# Patient Record
Sex: Female | Born: 1985 | Race: Asian | Hispanic: No | Marital: Married | Smoking: Never smoker
Health system: Southern US, Community
[De-identification: ages and names within clinical notes are randomized; demographics above are authoritative.]

## PROBLEM LIST (undated history)

## (undated) DIAGNOSIS — Z789 Other specified health status: Secondary | ICD-10-CM

## (undated) HISTORY — PX: NO PAST SURGERIES: SHX2092

## (undated) HISTORY — DX: Other specified health status: Z78.9

---

## 2009-08-14 ENCOUNTER — Ambulatory Visit (HOSPITAL_COMMUNITY): Admission: RE | Admit: 2009-08-14 | Discharge: 2009-08-14 | Payer: Self-pay | Admitting: Obstetrics and Gynecology

## 2009-08-17 ENCOUNTER — Encounter (INDEPENDENT_AMBULATORY_CARE_PROVIDER_SITE_OTHER): Payer: Self-pay | Admitting: Obstetrics and Gynecology

## 2009-08-17 ENCOUNTER — Inpatient Hospital Stay (HOSPITAL_COMMUNITY): Admission: RE | Admit: 2009-08-17 | Discharge: 2009-08-19 | Payer: Self-pay | Admitting: Obstetrics and Gynecology

## 2010-10-28 LAB — CBC
Hemoglobin: 13 g/dL (ref 12.0–15.0)
MCHC: 34 g/dL (ref 30.0–36.0)
RBC: 4.21 MIL/uL (ref 3.87–5.11)
RDW: 12.9 % (ref 11.5–15.5)
WBC: 11.2 10*3/uL — ABNORMAL HIGH (ref 4.0–10.5)

## 2010-10-28 LAB — OVA AND PARASITE EXAMINATION: Ova and parasites: NONE SEEN

## 2010-10-28 LAB — RPR: RPR Ser Ql: NONREACTIVE

## 2013-08-12 NOTE — L&D Delivery Note (Signed)
Patient is 28 y.o. [id number] 6568w5d by LMP admitted in active labor, hx of SGA current pregnancy, abnormal 1h gtt with normal 3h gtt   Delivery Note At 11:18 AM a viable female was delivered via Vaginal, Spontaneous Delivery (Presentation: ; Occiput Anterior with compound posterior hand).  APGAR: 8, 9; weight  .   Placenta status: Intact, Spontaneous.  Cord: 3 vessels with the following complications: None  Anesthesia: None  Episiotomy: None Lacerations: perineal abrasion, no lacerations Suture Repair: n/a Est. Blood Loss (mL): 300  Mom to postpartum.  Baby to Couplet care / Skin to Skin.  Heather Villa ROCIO 07/04/2014, 11:46 AM

## 2013-11-06 ENCOUNTER — Emergency Department (HOSPITAL_COMMUNITY)
Admission: EM | Admit: 2013-11-06 | Discharge: 2013-11-06 | Disposition: A | Discharge status: Home / Self Care | Payer: 59 | Attending: Emergency Medicine | Admitting: Emergency Medicine

## 2013-11-06 ENCOUNTER — Encounter (HOSPITAL_COMMUNITY): Payer: Self-pay | Admitting: Emergency Medicine

## 2013-11-06 DIAGNOSIS — H11422 Conjunctival edema, left eye: Secondary | ICD-10-CM

## 2013-11-06 DIAGNOSIS — H11429 Conjunctival edema, unspecified eye: Secondary | ICD-10-CM | POA: Insufficient documentation

## 2013-11-06 MED ORDER — FLUORESCEIN SODIUM 1 MG OP STRP
ORAL_STRIP | OPHTHALMIC | Status: AC
  Filled 2013-11-06: qty 1

## 2013-11-06 MED ORDER — KETOTIFEN FUMARATE 0.025 % OP SOLN
1.0000 [drp] | Freq: Two times a day (BID) | OPHTHALMIC | Status: DC
  Administered 2013-11-06: 1 [drp] via OPHTHALMIC
  Filled 2013-11-06: qty 5

## 2013-11-06 MED ORDER — TETRACAINE HCL 0.5 % OP SOLN
1.0000 [drp] | Freq: Once | OPHTHALMIC | Status: AC
  Administered 2013-11-06: 1 [drp] via OPHTHALMIC
  Filled 2013-11-06: qty 2

## 2013-11-06 MED ORDER — FLUORESCEIN SODIUM 1 MG OP STRP
1.0000 | ORAL_STRIP | Freq: Once | OPHTHALMIC | Status: AC
  Administered 2013-11-06: 1 via OPHTHALMIC

## 2013-11-06 NOTE — ED Provider Notes (Signed)
CSN: [id number]     Arrival date & time 11/06/13  1035 History   First MD Initiated Contact with Patient 11/06/13 1040    This chart was scribed for Fayrene Helper PA-C, a non-physician practitioner working with Gerhard Munch, MD by Lewanda Rife, ED Scribe. This patient was seen in room TR04C/TR04C and the patient's care was started at 10:41 AM      Chief Complaint  Patient presents with  . Eye Problem     (Consider location/radiation/quality/duration/timing/severity/associated sxs/prior Treatment) The history is provided by the patient. A language interpreter was used (Burmese).   HPI Comments: Heather Villa is a 28 y.o. female who presents to the Emergency Department complaining of constant moderate left pain onset 3 months. Describes left eye pain as foreign body sensation. Reports associated left eye itchiness, drainage, and redness. Reports trying OTC allergy drops with no relief of symptoms. Denies any alleviating or aggravating factors. Denies associated fever, chills, headache, diplopia, pain with eye movements, photophobia, and pain with eye movements. Denies following up with an eye doctor for this problem. Denies wearing corrective lenses. States she is [redacted] weeks pregnant. 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   No past medical history on file. No past surgical history on file. No family history on file. History  Substance Use Topics  . Smoking status: Not on file  . Smokeless tobacco: Not on file  . Alcohol Use: Not on file   OB History   No data available     Review of Systems  Constitutional: Negative for fever.  Eyes: Positive for pain, discharge, redness and itching.  Psychiatric/Behavioral: Negative for confusion.      Allergies  Review of patient's allergies indicates not on file.  Home Medications  No current outpatient prescriptions on file. There were no vitals taken for this visit. Physical Exam  Nursing note and vitals reviewed. Constitutional: She is  oriented to person, place, and time. She appears well-developed and well-nourished. No distress.  HENT:  Head: Normocephalic and atraumatic.  Eyes: EOM are normal. Pupils are equal, round, and reactive to light. Left eye exhibits chemosis. Left eye exhibits no discharge and no exudate. Left conjunctiva is injected.  Slit lamp exam:      The left eye shows no fluorescein uptake.  Chemosis through out, left eye is injected limbic sparing.   Mild edema noted to the inferior left periorbital region without evidence of cellulitis  Neck: Neck supple. No tracheal deviation present.  Cardiovascular: Normal rate.   Pulmonary/Chest: Effort normal. No respiratory distress.  Musculoskeletal: Normal range of motion.  Neurological: She is alert and oriented to person, place, and time.  Skin: Skin is warm and dry.  Psychiatric: She has a normal mood and affect. Her behavior is normal.    ED Course  Procedures  COORDINATION OF CARE:  Nursing notes reviewed. Vital signs reviewed. Initial pt interview and examination performed.   10:42 AM-Discussed work up plan with pt at bedside, which includes fluorescein test. Pt agrees with plan.  11:14 AM Consulted with Dr. Jeraldine Loots about pt and agrees with treatment plan   11:48 AM Patient with left eye irritation, conjunctivitis, and in chemosis for the past 3 months. No visual changes, no pain with eye movement, and no crusting to suggest acute infection. Vision is 20/20 in both eyes. I suspect the symptoms likely related to an allergic reaction. Plan to provide antihistamine drops,  Zaditor.  and have patient followup with ophthalmologist. I have consult with ophthalmology, Dr. Gwen Pounds  who mentioned it is safe for pregnant patient to take Zaditor.  Pt made aware i cannot r/u fb in eye.  But pt does note have any fluorence uptake on eye exam.    Treatment plan initiated:Medications - No data to display   Initial diagnostic testing ordered.    Labs  Review Labs Reviewed - No data to display Imaging Review No results found.   EKG Interpretation None      MDM   Final diagnoses:  Chemosis of left conjunctiva    BP 115/74  Pulse 71  Temp(Src) 98.1 F (36.7 C) (Oral)  Resp 18  Ht 5' (1.524 m)  Wt 100 lb (45.36 kg)  BMI 19.53 kg/m2  SpO2 100%   I personally performed the services described in this documentation, which was scribed in my presence. The recorded information has been reviewed and is accurate.     Fayrene HelperBowie Arther Heisler, PA-C 11/06/13 1150

## 2013-11-06 NOTE — Discharge Instructions (Signed)
Please follow up closely with your eye specialist.  Apply 1 drop to left eye every 12 hrs for symptomatic treatment of your eye discomfort.  Return if you notice changes in vision or if you have any other concerns.  [facility] further management of your pregnancy.

## 2013-11-06 NOTE — ED Provider Notes (Signed)
  Medical screening examination/treatment/procedure(s) were performed by non-physician practitioner and as supervising physician I was immediately available for consultation/collaboration.   EKG Interpretation None         Waneda Klammer, MD 11/06/13 1335 

## 2013-11-06 NOTE — ED Notes (Signed)
Pt reports eyes itching for 3 mos; drainage; swollen lower lid; conjuctiva reddened and irritated. Allergy drops OTC

## 2013-11-06 NOTE — ED Notes (Signed)
Pharm called to remind to send drops.

## 2013-11-06 NOTE — ED Notes (Addendum)
Pt reports she is [redacted] weeks pregnant; having left eye issues for 3 mos. PA at bedside; interpreter phone used.

## 2013-11-06 NOTE — ED Notes (Signed)
PA and RN at bedside explained POC; pt has no questions. Interpretter phone used.

## 2013-11-23 ENCOUNTER — Ambulatory Visit (INDEPENDENT_AMBULATORY_CARE_PROVIDER_SITE_OTHER): Payer: 59

## 2013-11-23 DIAGNOSIS — Z3201 Encounter for pregnancy test, result positive: Secondary | ICD-10-CM

## 2013-11-23 DIAGNOSIS — N926 Irregular menstruation, unspecified: Secondary | ICD-10-CM

## 2013-11-23 LAB — POCT PREGNANCY, URINE: Preg Test, Ur: POSITIVE — AB

## 2013-11-23 NOTE — Progress Notes (Signed)
Pt. Here today for pregnancy test. Test positive. Pt. Would like to start care here. Pt. Speaks Burmese chin, husband here to interpret today. Pt. Reports LMP 09/22/13 and states periods were regular.  Pt. 9w 1d based on LMP today and EDD June 27, 2014. Growth and anatomy ultrasound scheduled today for June 26 at 0930. OB labs today. Letter of verification given. Pt. To schedule New OB appointment and Burmese interpreter to be present at that time.

## 2013-11-24 LAB — OBSTETRIC PANEL
Antibody Screen: NEGATIVE
BASOS PCT: 0 % (ref 0–1)
Basophils Absolute: 0 10*3/uL (ref 0.0–0.1)
EOS ABS: 0.4 10*3/uL (ref 0.0–0.7)
Eosinophils Relative: 5 % (ref 0–5)
HCT: 37.6 % (ref 36.0–46.0)
Hemoglobin: 13.4 g/dL (ref 12.0–15.0)
Hepatitis B Surface Ag: NEGATIVE
LYMPHS ABS: 1.6 10*3/uL (ref 0.7–4.0)
Lymphocytes Relative: 20 % (ref 12–46)
MCH: 28.9 pg (ref 26.0–34.0)
MCHC: 35.6 g/dL (ref 30.0–36.0)
MCV: 81.2 fL (ref 78.0–100.0)
MONO ABS: 0.7 10*3/uL (ref 0.1–1.0)
Monocytes Relative: 8 % (ref 3–12)
Neutro Abs: 5.5 10*3/uL (ref 1.7–7.7)
Neutrophils Relative %: 67 % (ref 43–77)
PLATELETS: 236 10*3/uL (ref 150–400)
RBC: 4.63 MIL/uL (ref 3.87–5.11)
RDW: 13 % (ref 11.5–15.5)
RH TYPE: POSITIVE
Rubella: 8.4 Index — ABNORMAL HIGH (ref <0.90)
WBC: 8.2 10*3/uL (ref 4.0–10.5)

## 2013-11-24 LAB — HIV ANTIBODY (ROUTINE TESTING W REFLEX): HIV 1&2 Ab, 4th Generation: NONREACTIVE

## 2013-11-25 LAB — HEMOGLOBINOPATHY EVALUATION
HEMOGLOBIN OTHER: 0 %
HGB F QUANT: 0 % (ref 0.0–2.0)
HGB S QUANTITAION: 0 %
Hgb A2 Quant: 2.5 % (ref 2.2–3.2)
Hgb A: 97.5 % (ref 96.8–97.8)

## 2013-12-21 ENCOUNTER — Encounter: Payer: Self-pay | Admitting: Advanced Practice Midwife

## 2013-12-21 ENCOUNTER — Ambulatory Visit (INDEPENDENT_AMBULATORY_CARE_PROVIDER_SITE_OTHER): Payer: 59 | Admitting: Advanced Practice Midwife

## 2013-12-21 ENCOUNTER — Encounter: Payer: Self-pay | Admitting: Obstetrics and Gynecology

## 2013-12-21 VITALS — BP 122/79 | HR 80 | Temp 97.3°F | Wt 103.4 lb

## 2013-12-21 DIAGNOSIS — Z3480 Encounter for supervision of other normal pregnancy, unspecified trimester: Secondary | ICD-10-CM

## 2013-12-21 DIAGNOSIS — Z3490 Encounter for supervision of normal pregnancy, unspecified, unspecified trimester: Secondary | ICD-10-CM

## 2013-12-21 DIAGNOSIS — O09299 Supervision of pregnancy with other poor reproductive or obstetric history, unspecified trimester: Secondary | ICD-10-CM | POA: Insufficient documentation

## 2013-12-21 DIAGNOSIS — O09212 Supervision of pregnancy with history of pre-term labor, second trimester: Secondary | ICD-10-CM

## 2013-12-21 DIAGNOSIS — Z609 Problem related to social environment, unspecified: Secondary | ICD-10-CM

## 2013-12-21 DIAGNOSIS — Z603 Acculturation difficulty: Secondary | ICD-10-CM | POA: Insufficient documentation

## 2013-12-21 DIAGNOSIS — O09219 Supervision of pregnancy with history of pre-term labor, unspecified trimester: Secondary | ICD-10-CM

## 2013-12-21 LAB — POCT URINALYSIS DIP (DEVICE)
Bilirubin Urine: NEGATIVE
Glucose, UA: 100 mg/dL — AB
Hgb urine dipstick: NEGATIVE
Ketones, ur: NEGATIVE mg/dL
LEUKOCYTES UA: NEGATIVE
Nitrite: NEGATIVE
PH: 6 (ref 5.0–8.0)
PROTEIN: 30 mg/dL — AB
Specific Gravity, Urine: 1.025 (ref 1.005–1.030)
UROBILINOGEN UA: 0.2 mg/dL (ref 0.0–1.0)

## 2013-12-21 NOTE — Progress Notes (Signed)
New OB    See smartset  Subjective:    Heather Villa is a [id number] 1937w6d being seen today for her first obstetrical visit.  Her obstetrical history is significant for Hx IUGR. Patient does intend to breast feed. Pregnancy history fully reviewed.  Patient reports no complaints.  Filed Vitals:   12/21/13 1450  BP: 122/79  Pulse: 80  Temp: 97.3 F (36.3 C)  Weight: 103 lb 6.4 oz (46.902 kg)    HISTORY: OB History  Gravida Para Term Preterm AB SAB TAB Ectopic Multiple Living  3 2 1 1  0 0 0 0 0 2    # Outcome Date GA Lbr Len/2nd Weight Sex Delivery Anes PTL Lv  3 CUR           2 PRE 08/17/09 7843w0d  5 lb 8 oz (2.495 kg) F SVD   Y  1 TRM 04/17/08 4225w0d  7 lb 8 oz (3.402 kg) M SVD   Y     Past Medical History  Diagnosis Date  . Medical history non-contributory    Past Surgical History  Procedure Laterality Date  . No past surgeries     History reviewed. No pertinent family history.   Exam    Uterus:  Fundal Height: 10 cm  Pelvic Exam:    Perineum: No Hemorrhoids, Normal Perineum   Vulva: Bartholin's, Urethra, Skene's normal   Vagina:  normal discharge   pH:    Cervix: multiparous appearance and no cervical motion tenderness   Adnexa: no mass, fullness, tenderness   Bony Pelvis: gynecoid  System: Breast:  normal appearance, no masses or tenderness   Skin: normal coloration and turgor, no rashes    Neurologic: oriented, grossly non-focal   Extremities: normal strength, tone, and muscle mass   HEENT neck supple with midline trachea   Mouth/Teeth mucous membranes moist, pharynx normal without lesions   Neck supple and no masses   Cardiovascular: regular rate and rhythm   Respiratory:  appears well, vitals normal, no respiratory distress, acyanotic, normal RR, ear and throat exam is normal, neck free of mass or lymphadenopathy, chest clear, no wheezing, crepitations, rhonchi, normal symmetric air entry   Abdomen: soft, non-tender; bowel sounds normal; no masses,  no  organomegaly   Urinary: urethral meatus normal      Assessment:    Pregnancy: [id number] Patient Active Problem List   Diagnosis Date Noted  . Language barrier, cultural differences 12/21/2013  . Prior pregnancy complicated by IUGR, antepartum 12/21/2013        Plan:     Initial labs drawn. Prenatal vitamins. Problem list reviewed and updated. Genetic Screening discussed First Screen: ordered.  Ultrasound discussed; fetal survey: ordered.  Follow up in 4 weeks. 50% of 30 min visit spent on counseling and coordination of care.     Heather Villa 12/21/2013

## 2013-12-21 NOTE — Patient Instructions (Signed)
Pregnancy - First Trimester  During sexual intercourse, millions of sperm go into the vagina. Only 1 sperm will penetrate and fertilize the female egg while it is in the Fallopian tube. One week later, the fertilized egg implants into the wall of the uterus. An embryo begins to develop into a baby. At 6 to 8 weeks, the eyes and face are formed and the heartbeat can be seen on ultrasound. At the end of 12 weeks (first trimester), all the baby's organs are formed. Now that you are pregnant, you will want to do everything you can to have a healthy baby. Two of the most important things are to get good prenatal care and follow your caregiver's instructions. Prenatal care is all the medical care you receive before the baby's birth. It is given to prevent, find, and treat problems during the pregnancy and childbirth.  PRENATAL EXAMS  · During prenatal visits, your weight, blood pressure, and urine are checked. This is done to make sure you are healthy and progressing normally during the pregnancy.  · A pregnant woman should gain 25 to 35 pounds during the pregnancy. However, if you are overweight or underweight, your caregiver will advise you regarding your weight.  · Your caregiver will ask and answer questions for you.  · Blood work, cervical cultures, other necessary tests, and a Pap test are done during your prenatal exams. These tests are done to check on your health and the probable health of your baby. Tests are strongly recommended and done for HIV with your permission. This is the virus that causes AIDS. These tests are done because medicines can be given to help prevent your baby from being born with this infection should you have been infected without knowing it. Blood work is also used to find out your blood type, previous infections, and follow your blood levels (hemoglobin).  · Low hemoglobin (anemia) is common during pregnancy. Iron and vitamins are given to help prevent this. Later in the pregnancy, blood  tests for diabetes will be done along with any other tests if any problems develop.  · You may need other tests to make sure you and the baby are doing well.  CHANGES DURING THE FIRST TRIMESTER   Your body goes through many changes during pregnancy. They vary from person to person. Talk to your caregiver about changes you notice and are concerned about. Changes can include:  · Your menstrual period stops.  · The egg and sperm carry the genes that determine what you look like. Genes from you and your partner are forming a baby. The female genes determine whether the baby is a boy or a girl.  · Your body increases in girth and you may feel bloated.  · Feeling sick to your stomach (nauseous) and throwing up (vomiting). If the vomiting is uncontrollable, call your caregiver.  · Your breasts will begin to enlarge and become tender.  · Your nipples may stick out more and become darker.  · The need to urinate more. Painful urination may mean you have a bladder infection.  · Tiring easily.  · Loss of appetite.  · Cravings for certain kinds of food.  · At first, you may gain or lose a couple of pounds.  · You may have changes in your emotions from day to day (excited to be pregnant or concerned something may go wrong with the pregnancy and baby).  · You may have more vivid and strange dreams.  HOME CARE INSTRUCTIONS   ·   It is very important to avoid all smoking, alcohol and non-prescribed drugs during your pregnancy. These affect the formation and growth of the baby. Avoid chemicals while pregnant to ensure the delivery of a healthy infant.  · Start your prenatal visits by the 12th week of pregnancy. They are usually scheduled monthly at first, then more often in the last 2 months before delivery. Keep your caregiver's appointments. Follow your caregiver's instructions regarding medicine use, blood and lab tests, exercise, and diet.  · During pregnancy, you are providing food for you and your baby. Eat regular, well-balanced  meals. Choose foods such as meat, fish, milk and other low fat dairy products, vegetables, fruits, and whole-grain breads and cereals. Your caregiver will tell you of the ideal weight gain.  · You can help morning sickness by keeping soda crackers at the bedside. Eat a couple before arising in the morning. You may want to use the crackers without salt on them.  · Eating 4 to 5 small meals rather than 3 large meals a day also may help the nausea and vomiting.  · Drinking liquids between meals instead of during meals also seems to help nausea and vomiting.  · A physical sexual relationship may be continued throughout pregnancy if there are no other problems. Problems may be early (premature) leaking of amniotic fluid from the membranes, vaginal bleeding, or belly (abdominal) pain.  · Exercise regularly if there are no restrictions. Check with your caregiver or physical therapist if you are unsure of the safety of some of your exercises. Greater weight gain will occur in the last 2 trimesters of pregnancy. Exercising will help:  · Control your weight.  · Keep you in shape.  · Prepare you for labor and delivery.  · Help you lose your pregnancy weight after you deliver your baby.  · Wear a good support or jogging bra for breast tenderness during pregnancy. This may help if worn during sleep too.  · Ask when prenatal classes are available. Begin classes when they are offered.  · Do not use hot tubs, steam rooms, or saunas.  · Wear your seat belt when driving. This protects you and your baby if you are in an accident.  · Avoid raw meat, uncooked cheese, cat litter boxes, and soil used by cats throughout the pregnancy. These carry germs that can cause birth defects in the baby.  · The first trimester is a good time to visit your dentist for your dental health. Getting your teeth cleaned is okay. Use a softer toothbrush and brush gently during pregnancy.  · Ask for help if you have financial, counseling, or nutritional needs  during pregnancy. Your caregiver will be able to offer counseling for these needs as well as refer you for other special needs.  · Do not take any medicines or herbs unless told by your caregiver.  · Inform your caregiver if there is any mental or physical domestic violence.  · Make a list of emergency phone numbers of family, friends, hospital, and police and fire departments.  · Write down your questions. Take them to your prenatal visit.  · Do not douche.  · Do not cross your legs.  · If you have to stand for long periods of time, rotate you feet or take small steps in a circle.  · You may have more vaginal secretions that may require a sanitary pad. Do not use tampons or scented sanitary pads.  MEDICINES AND DRUG USE IN PREGNANCY  ·   Take prenatal vitamins as directed. The vitamin should contain 1 milligram of folic acid. Keep all vitamins out of reach of children. Only a couple vitamins or tablets containing iron may be fatal to a baby or young child when ingested.  · Avoid use of all medicines, including herbs, over-the-counter medicines, not prescribed or suggested by your caregiver. Only take over-the-counter or prescription medicines for pain, discomfort, or fever as directed by your caregiver. Do not use aspirin, ibuprofen, or naproxen unless directed by your caregiver.  · Let your caregiver also know about herbs you may be using.  · Alcohol is related to a number of birth defects. This includes fetal alcohol syndrome. All alcohol, in any form, should be avoided completely. Smoking will cause low birth rate and premature babies.  · Street or illegal drugs are very harmful to the baby. They are absolutely forbidden. A baby born to an addicted mother will be addicted at birth. The baby will go through the same withdrawal an adult does.  · Let your caregiver know about any medicines that you have to take and for what reason you take them.  SEEK MEDICAL CARE IF:   You have any concerns or worries during your  pregnancy. It is better to call with your questions if you feel they cannot wait, rather than worry about them.  SEEK IMMEDIATE MEDICAL CARE IF:   · An unexplained oral temperature above 102° F (38.9° C) develops, or as your caregiver suggests.  · You have leaking of fluid from the vagina (birth canal). If leaking membranes are suspected, take your temperature and inform your caregiver of this when you call.  · There is vaginal spotting or bleeding. Notify your caregiver of the amount and how many pads are used.  · You develop a bad smelling vaginal discharge with a change in the color.  · You continue to feel sick to your stomach (nauseated) and have no relief from remedies suggested. You vomit blood or coffee ground-like materials.  · You lose more than 2 pounds of weight in 1 week.  · You gain more than 2 pounds of weight in 1 week and you notice swelling of your face, hands, feet, or legs.  · You gain 5 pounds or more in 1 week (even if you do not have swelling of your hands, face, legs, or feet).  · You get exposed to German measles and have never had them.  · You are exposed to fifth disease or chickenpox.  · You develop belly (abdominal) pain. Round ligament discomfort is a common non-cancerous (benign) cause of abdominal pain in pregnancy. Your caregiver still must evaluate this.  · You develop headache, fever, diarrhea, pain with urination, or shortness of breath.  · You fall or are in a car accident or have any kind of trauma.  · There is mental or physical violence in your home.  Document Released: 07/23/2001 Document Revised: 04/22/2012 Document Reviewed: 01/24/2009  ExitCare® Patient Information ©2014 [employer].

## 2013-12-22 ENCOUNTER — Other Ambulatory Visit: Payer: Self-pay | Admitting: Advanced Practice Midwife

## 2013-12-22 DIAGNOSIS — Z3682 Encounter for antenatal screening for nuchal translucency: Secondary | ICD-10-CM

## 2013-12-23 LAB — PRESCRIPTION MONITORING PROFILE (19 PANEL)
AMPHETAMINE/METH: NEGATIVE ng/mL
BARBITURATE SCREEN, URINE: NEGATIVE ng/mL
Benzodiazepine Screen, Urine: NEGATIVE ng/mL
Buprenorphine, Urine: NEGATIVE ng/mL
Cannabinoid Scrn, Ur: NEGATIVE ng/mL
Carisoprodol, Urine: NEGATIVE ng/mL
Cocaine Metabolites: NEGATIVE ng/mL
Creatinine, Urine: 155.52 mg/dL (ref >20.0)
FENTANYL URINE: NEGATIVE ng/mL
MDMA URINE: NEGATIVE ng/mL
MEPERIDINE UR: NEGATIVE ng/mL
METHAQUALONE SCREEN (URINE): NEGATIVE ng/mL
Methadone Screen, Urine: NEGATIVE ng/mL
Nitrites, Initial: NEGATIVE ug/mL
OPIATE SCREEN, URINE: NEGATIVE ng/mL
Oxycodone Screen, Ur: NEGATIVE ng/mL
PHENCYCLIDINE, UR: NEGATIVE ng/mL
Propoxyphene: NEGATIVE ng/mL
TAPENTADOLUR: NEGATIVE ng/mL
TRAMADOL UR: NEGATIVE ng/mL
Zolpidem, Urine: NEGATIVE ng/mL
pH, Initial: 6.4 pH (ref 4.5–8.9)

## 2013-12-23 LAB — CULTURE, OB URINE
Colony Count: NO GROWTH
ORGANISM ID, BACTERIA: NO GROWTH

## 2013-12-28 ENCOUNTER — Ambulatory Visit (HOSPITAL_COMMUNITY)
Admission: RE | Admit: 2013-12-28 | Discharge: 2013-12-28 | Disposition: A | Discharge status: Home / Self Care | Payer: 59 | Source: Ambulatory Visit | Attending: Advanced Practice Midwife | Admitting: Advanced Practice Midwife

## 2013-12-28 ENCOUNTER — Encounter (HOSPITAL_COMMUNITY): Payer: Self-pay

## 2013-12-28 DIAGNOSIS — Z3682 Encounter for antenatal screening for nuchal translucency: Secondary | ICD-10-CM

## 2013-12-28 DIAGNOSIS — Z36 Encounter for antenatal screening of mother: Secondary | ICD-10-CM | POA: Insufficient documentation

## 2013-12-29 ENCOUNTER — Encounter: Payer: Self-pay | Admitting: Advanced Practice Midwife

## 2013-12-30 ENCOUNTER — Other Ambulatory Visit: Payer: Self-pay

## 2014-01-03 ENCOUNTER — Encounter: Payer: Self-pay | Admitting: Advanced Practice Midwife

## 2014-01-06 DIAGNOSIS — Z369 Encounter for antenatal screening, unspecified: Secondary | ICD-10-CM

## 2014-01-19 ENCOUNTER — Encounter: Payer: Self-pay | Admitting: Obstetrics and Gynecology

## 2014-01-19 ENCOUNTER — Other Ambulatory Visit: Payer: Self-pay | Admitting: Obstetrics and Gynecology

## 2014-01-19 ENCOUNTER — Ambulatory Visit: Payer: 59 | Admitting: Obstetrics and Gynecology

## 2014-01-19 VITALS — BP 104/77 | HR 101 | Temp 98.0°F | Wt 104.2 lb

## 2014-01-19 DIAGNOSIS — Z3480 Encounter for supervision of other normal pregnancy, unspecified trimester: Secondary | ICD-10-CM

## 2014-01-19 DIAGNOSIS — O209 Hemorrhage in early pregnancy, unspecified: Secondary | ICD-10-CM

## 2014-01-19 LAB — POCT URINALYSIS DIP (DEVICE)
BILIRUBIN URINE: NEGATIVE
Glucose, UA: NEGATIVE mg/dL
Hgb urine dipstick: NEGATIVE
Ketones, ur: NEGATIVE mg/dL
NITRITE: NEGATIVE
Protein, ur: 30 mg/dL — AB
SPECIFIC GRAVITY, URINE: 1.02 (ref 1.005–1.030)
Urobilinogen, UA: 0.2 mg/dL (ref 0.0–1.0)
pH: 6 (ref 5.0–8.0)

## 2014-01-19 NOTE — Patient Instructions (Signed)
Vaginal Bleeding During Pregnancy, Second Trimester °A small amount of bleeding (spotting) from the vagina is relatively common in pregnancy. It usually stops on its own. Various things can cause bleeding or spotting in pregnancy. Some bleeding may be related to the pregnancy, and some may not. Sometimes the bleeding is normal and is not a problem. However, bleeding can also be a sign of something serious. Be sure to tell your health care provider about any vaginal bleeding right away. °Some possible causes of vaginal bleeding during the second trimester include: °· Infection, inflammation, or growths on the cervix.   °· The placenta may be partially or completely covering the opening of the cervix inside the uterus (placenta previa). °· The placenta may have separated from the uterus (abruption of the placenta).   °· You may be having early (preterm) labor.   °· The cervix may not be strong enough to keep a baby inside the uterus (cervical insufficiency).   °· Tiny cysts may have developed in the uterus instead of pregnancy tissue (molar pregnancy).  °HOME CARE INSTRUCTIONS  °Watch your condition for any changes. The following actions may help to lessen any discomfort you are feeling: °· Follow your health care provider's instructions for limiting your activity. If your health care provider orders bed rest, you may need to stay in bed and only get up to use the bathroom. However, your health care provider may allow you to continue light activity. °· If needed, make plans for someone to help with your regular activities and responsibilities while you are on bed rest. °· Keep track of the number of pads you use each day, how often you change pads, and how soaked (saturated) they are. Write this down. °· Do not use tampons. Do not douche. °· Do not have sexual intercourse or orgasms until approved by your health care provider. °· If you pass any tissue from your vagina, save the tissue so you can show it to your  health care provider. °· Only take over-the-counter or prescription medicines as directed by your health care provider. °· Do not take aspirin because it can make you bleed. °· Do not exercise or perform any strenuous activities or heavy lifting without your health care provider's permission. °· Keep all follow-up appointments as directed by your health care provider. °SEEK MEDICAL CARE IF: °· You have any vaginal bleeding during any part of your pregnancy. °· You have cramps or labor pains. °SEEK IMMEDIATE MEDICAL CARE IF:  °· You have severe cramps in your back or belly (abdomen). °· You have contractions. °· You have a fever, not controlled by medicine. °· You have chills. °· You pass large clots or tissue from your vagina. °· Your bleeding increases. °· You feel lightheaded or weak, or you have fainting episodes. °· You are leaking fluid or have a gush of fluid from your vagina. °MAKE SURE YOU: °· Understand these instructions. °· Will watch your condition. °· Will get help right away if you are not doing well or get worse. °Document Released: 05/08/2005 Document Revised: 05/19/2013 Document Reviewed: 04/05/2013 °ExitCare® Patient Information ©2014 [employer]. ° °

## 2014-01-19 NOTE — Progress Notes (Signed)
[place] scheduled 6/26.  NT normal> will get MSAFP today. Reports post coital spotting over last month, last occurred 2 wks ago. Pap neg 12/21/13. GC/CT neg. No irritative d/c. Advised pelvic rest until after [place]. Return if BRB or abd pain.

## 2014-01-20 LAB — ALPHA FETOPROTEIN, MATERNAL
AFP: 71.5 [IU]/mL
CURR GEST AGE: 17 wks.days
MoM for AFP: 1.61
Open Spina bifida: NEGATIVE

## 2014-02-04 ENCOUNTER — Ambulatory Visit (HOSPITAL_COMMUNITY)
Admission: RE | Admit: 2014-02-04 | Discharge: 2014-02-04 | Disposition: A | Discharge status: Home / Self Care | Payer: 59 | Source: Ambulatory Visit | Attending: Obstetrics & Gynecology | Admitting: Obstetrics & Gynecology

## 2014-02-04 ENCOUNTER — Other Ambulatory Visit: Payer: Self-pay | Admitting: Obstetrics & Gynecology

## 2014-02-04 DIAGNOSIS — Z3201 Encounter for pregnancy test, result positive: Secondary | ICD-10-CM

## 2014-02-04 DIAGNOSIS — Z3689 Encounter for other specified antenatal screening: Secondary | ICD-10-CM | POA: Insufficient documentation

## 2014-02-05 ENCOUNTER — Encounter: Payer: Self-pay | Admitting: Obstetrics & Gynecology

## 2014-02-16 ENCOUNTER — Encounter: Payer: 59 | Admitting: Advanced Practice Midwife

## 2014-02-16 ENCOUNTER — Ambulatory Visit (INDEPENDENT_AMBULATORY_CARE_PROVIDER_SITE_OTHER): Payer: 59 | Admitting: Family Medicine

## 2014-02-16 VITALS — BP 112/77 | HR 110 | Wt 105.1 lb

## 2014-02-16 DIAGNOSIS — Z609 Problem related to social environment, unspecified: Secondary | ICD-10-CM

## 2014-02-16 DIAGNOSIS — O09293 Supervision of pregnancy with other poor reproductive or obstetric history, third trimester: Secondary | ICD-10-CM

## 2014-02-16 DIAGNOSIS — O09299 Supervision of pregnancy with other poor reproductive or obstetric history, unspecified trimester: Secondary | ICD-10-CM

## 2014-02-16 DIAGNOSIS — Z369 Encounter for antenatal screening, unspecified: Secondary | ICD-10-CM

## 2014-02-16 DIAGNOSIS — Z603 Acculturation difficulty: Secondary | ICD-10-CM

## 2014-02-16 DIAGNOSIS — Z36 Encounter for antenatal screening of mother: Secondary | ICD-10-CM

## 2014-02-16 LAB — POCT URINALYSIS DIP (DEVICE)
BILIRUBIN URINE: NEGATIVE
GLUCOSE, UA: NEGATIVE mg/dL
Hgb urine dipstick: NEGATIVE
Ketones, ur: NEGATIVE mg/dL
LEUKOCYTES UA: NEGATIVE
NITRITE: NEGATIVE
PROTEIN: 100 mg/dL — AB
Specific Gravity, Urine: 1.025 (ref 1.005–1.030)
Urobilinogen, UA: 0.2 mg/dL (ref 0.0–1.0)
pH: 6 (ref 5.0–8.0)

## 2014-02-16 MED ORDER — ALBUTEROL SULFATE HFA 108 (90 BASE) MCG/ACT IN AERS: 2.0000 | INHALATION_SPRAY | Freq: Four times a day (QID) | RESPIRATORY_TRACT | Status: AC | PRN

## 2014-02-16 MED ORDER — AZITHROMYCIN 250 MG PO TABS: ORAL_TABLET | ORAL | Status: DC

## 2014-02-16 NOTE — Progress Notes (Signed)
S: 28 yo [id number] @ 8368w0d here for ROBV - has been doing well - some occasional cramping - did have some spotting on Sunday when wipes but none since  -cough x 2 weeks. Now with wheeze.  - no ctx, lof. +FM.   O: see flowsheet   A/P - f/u anatomy scan ordered - cough: rhonchi on exam in [facility] and end expiratory wheeze. rx of zpack and albuterol  - f/u here in 4 weeks.

## 2014-02-16 NOTE — Progress Notes (Signed)
Has pain in her abdomen. Reports bleeding this past Sunday.

## 2014-02-16 NOTE — Patient Instructions (Signed)
Second Trimester of Pregnancy The second trimester is from week 13 through week 28, months 4 through 6. The second trimester is often a time when you feel your best. Your body has also adjusted to being pregnant, and you begin to feel better physically. Usually, morning sickness has lessened or quit completely, you may have more energy, and you may have an increase in appetite. The second trimester is also a time when the fetus is growing rapidly. At the end of the sixth month, the fetus is about 9 inches long and weighs about 1 pounds. You will likely begin to feel the baby move (quickening) between 18 and 20 weeks of the pregnancy. BODY CHANGES Your body goes through many changes during pregnancy. The changes vary from woman to woman.   Your weight will continue to increase. You will notice your lower abdomen bulging out.  You may begin to get stretch marks on your hips, abdomen, and breasts.  You may develop headaches that can be relieved by medicines approved by your health care provider.  You may urinate more often because the fetus is pressing on your bladder.  You may develop or continue to have heartburn as a result of your pregnancy.  You may develop constipation because certain hormones are causing the muscles that push waste through your intestines to slow down.  You may develop hemorrhoids or swollen, bulging veins (varicose veins).  You may have back pain because of the weight gain and pregnancy hormones relaxing your joints between the bones in your pelvis and as a result of a shift in weight and the muscles that support your balance.  Your breasts will continue to grow and be tender.  Your gums may bleed and may be sensitive to brushing and flossing.  Dark spots or blotches (chloasma, mask of pregnancy) may develop on your face. This will likely fade after the baby is born.  A dark line from your belly button to the pubic area (linea nigra) may appear. This will likely fade  after the baby is born.  You may have changes in your hair. These can include thickening of your hair, rapid growth, and changes in texture. Some women also have hair loss during or after pregnancy, or hair that feels dry or thin. Your hair will most likely return to normal after your baby is born. WHAT TO EXPECT AT YOUR PRENATAL VISITS During a routine prenatal visit:  You will be weighed to make sure you and the fetus are growing normally.  Your blood pressure will be taken.  Your abdomen will be measured to track your baby's growth.  The fetal heartbeat will be listened to.  Any test results from the previous visit will be discussed. Your health care provider may ask you:  How you are feeling.  If you are feeling the baby move.  If you have had any abnormal symptoms, such as leaking fluid, bleeding, severe headaches, or abdominal cramping.  If you have any questions. Other tests that may be performed during your second trimester include:  Blood tests that check for:  Low iron levels (anemia).  Gestational diabetes (between 24 and 28 weeks).  Rh antibodies.  Urine tests to check for infections, diabetes, or protein in the urine.  An ultrasound to confirm the proper growth and development of the baby.  An amniocentesis to check for possible genetic problems.  Fetal screens for spina bifida and Down syndrome. HOME CARE INSTRUCTIONS   Avoid all smoking, herbs, alcohol, and unprescribed   drugs. These chemicals affect the formation and growth of the baby.  Follow your health care provider's instructions regarding medicine use. There are medicines that are either safe or unsafe to take during pregnancy.  Exercise only as directed by your health care provider. Experiencing uterine cramps is a good sign to stop exercising.  Continue to eat regular, healthy meals.  Wear a good support bra for breast tenderness.  Do not use hot tubs, steam rooms, or saunas.  Wear your  seat belt at all times when driving.  Avoid raw meat, uncooked cheese, cat litter boxes, and soil used by cats. These carry germs that can cause birth defects in the baby.  Take your prenatal vitamins.  Try taking a stool softener (if your health care provider approves) if you develop constipation. Eat more high-fiber foods, such as fresh vegetables or fruit and whole grains. Drink plenty of fluids to keep your urine clear or pale yellow.  Take warm sitz baths to soothe any pain or discomfort caused by hemorrhoids. Use hemorrhoid cream if your health care provider approves.  If you develop varicose veins, wear support hose. Elevate your feet for 15 minutes, 3-4 times a day. Limit salt in your diet.  Avoid heavy lifting, wear low heel shoes, and practice good posture.  Rest with your legs elevated if you have leg cramps or low back pain.  Visit your dentist if you have not gone yet during your pregnancy. Use a soft toothbrush to brush your teeth and be gentle when you floss.  A sexual relationship may be continued unless your health care provider directs you otherwise.  Continue to go to all your prenatal visits as directed by your health care provider. SEEK MEDICAL CARE IF:   You have dizziness.  You have mild pelvic cramps, pelvic pressure, or nagging pain in the abdominal area.  You have persistent nausea, vomiting, or diarrhea.  You have a bad smelling vaginal discharge.  You have pain with urination. SEEK IMMEDIATE MEDICAL CARE IF:   You have a fever.  You are leaking fluid from your vagina.  You have spotting or bleeding from your vagina.  You have severe abdominal cramping or pain.  You have rapid weight gain or loss.  You have shortness of breath with chest pain.  You notice sudden or extreme swelling of your face, hands, ankles, feet, or legs.  You have not felt your baby move in over an hour.  You have severe headaches that do not go away with  medicine.  You have vision changes. Document Released: 07/23/2001 Document Revised: 08/03/2013 Document Reviewed: 09/29/2012 [employer] Patient Information 2015 [employer]. This information is not intended to replace advice given to you by your health care provider. Make sure you discuss any questions you have with your health care provider.  

## 2014-02-28 ENCOUNTER — Encounter (HOSPITAL_COMMUNITY): Payer: Self-pay

## 2014-02-28 ENCOUNTER — Ambulatory Visit (HOSPITAL_COMMUNITY)
Admission: RE | Admit: 2014-02-28 | Discharge: 2014-02-28 | Disposition: A | Discharge status: Home / Self Care | Payer: Medicaid Other | Source: Ambulatory Visit | Attending: Family Medicine | Admitting: Family Medicine

## 2014-02-28 ENCOUNTER — Other Ambulatory Visit: Payer: Self-pay | Admitting: Family Medicine

## 2014-02-28 DIAGNOSIS — O09299 Supervision of pregnancy with other poor reproductive or obstetric history, unspecified trimester: Secondary | ICD-10-CM | POA: Diagnosis not present

## 2014-02-28 DIAGNOSIS — Z603 Acculturation difficulty: Secondary | ICD-10-CM

## 2014-02-28 DIAGNOSIS — Z36 Encounter for antenatal screening of mother: Secondary | ICD-10-CM | POA: Insufficient documentation

## 2014-02-28 DIAGNOSIS — Z369 Encounter for antenatal screening, unspecified: Secondary | ICD-10-CM

## 2014-02-28 DIAGNOSIS — O09293 Supervision of pregnancy with other poor reproductive or obstetric history, third trimester: Secondary | ICD-10-CM

## 2014-03-01 ENCOUNTER — Other Ambulatory Visit: Payer: Self-pay | Admitting: Family

## 2014-03-01 DIAGNOSIS — O09219 Supervision of pregnancy with history of pre-term labor, unspecified trimester: Secondary | ICD-10-CM

## 2014-03-16 ENCOUNTER — Ambulatory Visit (INDEPENDENT_AMBULATORY_CARE_PROVIDER_SITE_OTHER): Payer: Medicaid Other | Admitting: Physician Assistant

## 2014-03-16 VITALS — BP 106/75 | HR 94 | Temp 97.9°F | Wt 107.8 lb

## 2014-03-16 DIAGNOSIS — O09292 Supervision of pregnancy with other poor reproductive or obstetric history, second trimester: Secondary | ICD-10-CM

## 2014-03-16 DIAGNOSIS — O09299 Supervision of pregnancy with other poor reproductive or obstetric history, unspecified trimester: Secondary | ICD-10-CM

## 2014-03-16 LAB — POCT URINALYSIS DIP (DEVICE)
BILIRUBIN URINE: NEGATIVE
Glucose, UA: NEGATIVE mg/dL
HGB URINE DIPSTICK: NEGATIVE
Ketones, ur: NEGATIVE mg/dL
NITRITE: NEGATIVE
PH: 6.5 (ref 5.0–8.0)
Protein, ur: NEGATIVE mg/dL
Specific Gravity, Urine: 1.01 (ref 1.005–1.030)
UROBILINOGEN UA: 0.2 mg/dL (ref 0.0–1.0)

## 2014-03-16 NOTE — Progress Notes (Signed)
25weeks, stable IUP.  Scheduled for f/u growth scan 8/11.  RTC in 3 weeks.

## 2014-03-18 LAB — URINE CULTURE
Colony Count: NO GROWTH
ORGANISM ID, BACTERIA: NO GROWTH

## 2014-03-22 ENCOUNTER — Ambulatory Visit (HOSPITAL_COMMUNITY)
Admission: RE | Admit: 2014-03-22 | Discharge: 2014-03-22 | Disposition: A | Discharge status: Home / Self Care | Payer: Medicaid Other | Source: Ambulatory Visit | Attending: Family | Admitting: Family

## 2014-03-22 ENCOUNTER — Encounter: Payer: Self-pay | Admitting: Obstetrics & Gynecology

## 2014-03-22 ENCOUNTER — Encounter (HOSPITAL_COMMUNITY): Payer: Self-pay

## 2014-03-22 ENCOUNTER — Other Ambulatory Visit: Payer: Self-pay | Admitting: Family

## 2014-03-22 VITALS — BP 104/61 | HR 89 | Wt 109.8 lb

## 2014-03-22 DIAGNOSIS — Z3689 Encounter for other specified antenatal screening: Secondary | ICD-10-CM | POA: Diagnosis not present

## 2014-03-22 DIAGNOSIS — O09219 Supervision of pregnancy with history of pre-term labor, unspecified trimester: Secondary | ICD-10-CM

## 2014-03-22 DIAGNOSIS — O09293 Supervision of pregnancy with other poor reproductive or obstetric history, third trimester: Secondary | ICD-10-CM

## 2014-04-06 ENCOUNTER — Ambulatory Visit (INDEPENDENT_AMBULATORY_CARE_PROVIDER_SITE_OTHER): Payer: Medicaid Other | Admitting: Advanced Practice Midwife

## 2014-04-06 VITALS — BP 107/89 | HR 92 | Temp 98.0°F | Wt 110.0 lb

## 2014-04-06 DIAGNOSIS — Z3492 Encounter for supervision of normal pregnancy, unspecified, second trimester: Secondary | ICD-10-CM

## 2014-04-06 DIAGNOSIS — Z3490 Encounter for supervision of normal pregnancy, unspecified, unspecified trimester: Secondary | ICD-10-CM | POA: Insufficient documentation

## 2014-04-06 DIAGNOSIS — Z3480 Encounter for supervision of other normal pregnancy, unspecified trimester: Secondary | ICD-10-CM

## 2014-04-06 DIAGNOSIS — Z23 Encounter for immunization: Secondary | ICD-10-CM

## 2014-04-06 LAB — POCT URINALYSIS DIP (DEVICE)
Bilirubin Urine: NEGATIVE
Glucose, UA: NEGATIVE mg/dL
HGB URINE DIPSTICK: NEGATIVE
Ketones, ur: NEGATIVE mg/dL
LEUKOCYTES UA: NEGATIVE
NITRITE: NEGATIVE
PH: 5 (ref 5.0–8.0)
PROTEIN: NEGATIVE mg/dL
Specific Gravity, Urine: <=1.005 (ref 1.005–1.030)
UROBILINOGEN UA: 0.2 mg/dL (ref 0.0–1.0)

## 2014-04-06 LAB — CBC
HCT: 36.2 % (ref 36.0–46.0)
HEMOGLOBIN: 12.7 g/dL (ref 12.0–15.0)
MCH: 30.8 pg (ref 26.0–34.0)
MCHC: 35.1 g/dL (ref 30.0–36.0)
MCV: 87.7 fL (ref 78.0–100.0)
PLATELETS: 185 10*3/uL (ref 150–400)
RBC: 4.13 MIL/uL (ref 3.87–5.11)
RDW: 13.4 % (ref 11.5–15.5)
WBC: 10.4 10*3/uL (ref 4.0–10.5)

## 2014-04-06 MED ORDER — TETANUS-DIPHTH-ACELL PERTUSSIS 5-2.5-18.5 LF-MCG/0.5 IM SUSP: 0.5000 mL | Freq: Once | INTRAMUSCULAR | Status: DC

## 2014-04-06 NOTE — Progress Notes (Signed)
28 week labs. TDaP.  

## 2014-04-06 NOTE — Progress Notes (Signed)
28wk labs, Tdap vaccine 

## 2014-04-07 ENCOUNTER — Telehealth: Payer: Self-pay

## 2014-04-07 ENCOUNTER — Encounter: Payer: Self-pay | Admitting: Advanced Practice Midwife

## 2014-04-07 DIAGNOSIS — R7309 Other abnormal glucose: Secondary | ICD-10-CM | POA: Insufficient documentation

## 2014-04-07 LAB — GLUCOSE TOLERANCE, 1 HOUR (50G) W/O FASTING: Glucose, 1 Hour GTT: 147 mg/dL — ABNORMAL HIGH (ref 70–140)

## 2014-04-07 LAB — HIV ANTIBODY (ROUTINE TESTING W REFLEX): HIV 1&2 Ab, 4th Generation: NONREACTIVE

## 2014-04-07 LAB — RPR

## 2014-04-07 NOTE — Telephone Encounter (Signed)
Message copied by Louanna Raw on Thu Apr 07, 2014 11:47 AM ------      Message from: Aviva Signs      Created: Thu Apr 07, 2014  9:07 AM      Regarding: Needs 3 hr GTT       Glucola 147            Needs 3 hr test            Thanks      Hilda Lias ------

## 2014-04-07 NOTE — Telephone Encounter (Signed)
Called patient with pacific interpreter [phone]. Informed patient of results. Patient verbalized understanding and stated she could come Tuesday 04/12/14 at 0800. Informed patient we will put her on the schedule; informed her she must be fasting and can have nothing to eat or drink except for some water after midnight the night before. Patient verbalized understanding. No questions or concerns.

## 2014-04-11 NOTE — Patient Instructions (Signed)
Third Trimester of Pregnancy The third trimester is from week 29 through week 42, months 7 through 9. The third trimester is a time when the fetus is growing rapidly. At the end of the ninth month, the fetus is about 20 inches in length and weighs 6-10 pounds.  BODY CHANGES Your body goes through many changes during pregnancy. The changes vary from woman to woman.   Your weight will continue to increase. You can expect to gain 25-35 pounds (11-16 kg) by the end of the pregnancy.  You may begin to get stretch marks on your hips, abdomen, and breasts.  You may urinate more often because the fetus is moving lower into your pelvis and pressing on your bladder.  You may develop or continue to have heartburn as a result of your pregnancy.  You may develop constipation because certain hormones are causing the muscles that push waste through your intestines to slow down.  You may develop hemorrhoids or swollen, bulging veins (varicose veins).  You may have pelvic pain because of the weight gain and pregnancy hormones relaxing your joints between the bones in your pelvis. Backaches may result from overexertion of the muscles supporting your posture.  You may have changes in your hair. These can include thickening of your hair, rapid growth, and changes in texture. Some women also have hair loss during or after pregnancy, or hair that feels dry or thin. Your hair will most likely return to normal after your baby is born.  Your breasts will continue to grow and be tender. A yellow discharge may leak from your breasts called colostrum.  Your belly button may stick out.  You may feel short of breath because of your expanding uterus.  You may notice the fetus "dropping," or moving lower in your abdomen.  You may have a bloody mucus discharge. This usually occurs a few days to a week before labor begins.  Your cervix becomes thin and soft (effaced) near your due date. WHAT TO EXPECT AT YOUR PRENATAL  EXAMS  You will have prenatal exams every 2 weeks until week 36. Then, you will have weekly prenatal exams. During a routine prenatal visit:  You will be weighed to make sure you and the fetus are growing normally.  Your blood pressure is taken.  Your abdomen will be measured to track your baby's growth.  The fetal heartbeat will be listened to.  Any test results from the previous visit will be discussed.  You may have a cervical check near your due date to see if you have effaced. At around 36 weeks, your caregiver will check your cervix. At the same time, your caregiver will also perform a test on the secretions of the vaginal tissue. This test is to determine if a type of bacteria, Group B streptococcus, is present. Your caregiver will explain this further. Your caregiver may ask you:  What your birth plan is.  How you are feeling.  If you are feeling the baby move.  If you have had any abnormal symptoms, such as leaking fluid, bleeding, severe headaches, or abdominal cramping.  If you have any questions. Other tests or screenings that may be performed during your third trimester include:  Blood tests that check for low iron levels (anemia).  Fetal testing to check the health, activity level, and growth of the fetus. Testing is done if you have certain medical conditions or if there are problems during the pregnancy. FALSE LABOR You may feel small, irregular contractions that   eventually go away. These are called Braxton Hicks contractions, or false labor. Contractions may last for hours, days, or even weeks before true labor sets in. If contractions come at regular intervals, intensify, or become painful, it is best to be seen by your caregiver.  SIGNS OF LABOR   Menstrual-like cramps.  Contractions that are 5 minutes apart or less.  Contractions that start on the top of the uterus and spread down to the lower abdomen and back.  A sense of increased pelvic pressure or back  pain.  A watery or bloody mucus discharge that comes from the vagina. If you have any of these signs before the 37th week of pregnancy, call your caregiver right away. You need to go to the hospital to get checked immediately. HOME CARE INSTRUCTIONS   Avoid all smoking, herbs, alcohol, and unprescribed drugs. These chemicals affect the formation and growth of the baby.  Follow your caregiver's instructions regarding medicine use. There are medicines that are either safe or unsafe to take during pregnancy.  Exercise only as directed by your caregiver. Experiencing uterine cramps is a good sign to stop exercising.  Continue to eat regular, healthy meals.  Wear a good support bra for breast tenderness.  Do not use hot tubs, steam rooms, or saunas.  Wear your seat belt at all times when driving.  Avoid raw meat, uncooked cheese, cat litter boxes, and soil used by cats. These carry germs that can cause birth defects in the baby.  Take your prenatal vitamins.  Try taking a stool softener (if your caregiver approves) if you develop constipation. Eat more high-fiber foods, such as fresh vegetables or fruit and whole grains. Drink plenty of fluids to keep your urine clear or pale yellow.  Take warm sitz baths to soothe any pain or discomfort caused by hemorrhoids. Use hemorrhoid cream if your caregiver approves.  If you develop varicose veins, wear support hose. Elevate your feet for 15 minutes, 3-4 times a day. Limit salt in your diet.  Avoid heavy lifting, wear low heal shoes, and practice good posture.  Rest a lot with your legs elevated if you have leg cramps or low back pain.  Visit your dentist if you have not gone during your pregnancy. Use a soft toothbrush to brush your teeth and be gentle when you floss.  A sexual relationship may be continued unless your caregiver directs you otherwise.  Do not travel far distances unless it is absolutely necessary and only with the approval  of your caregiver.  Take prenatal classes to understand, practice, and ask questions about the labor and delivery.  Make a trial run to the hospital.  Pack your hospital bag.  Prepare the baby's nursery.  Continue to go to all your prenatal visits as directed by your caregiver. SEEK MEDICAL CARE IF:  You are unsure if you are in labor or if your water has broken.  You have dizziness.  You have mild pelvic cramps, pelvic pressure, or nagging pain in your abdominal area.  You have persistent nausea, vomiting, or diarrhea.  You have a bad smelling vaginal discharge.  You have pain with urination. SEEK IMMEDIATE MEDICAL CARE IF:   You have a fever.  You are leaking fluid from your vagina.  You have spotting or bleeding from your vagina.  You have severe abdominal cramping or pain.  You have rapid weight loss or gain.  You have shortness of breath with chest pain.  You notice sudden or extreme swelling   of your face, hands, ankles, feet, or legs.  You have not felt your baby move in over an hour.  You have severe headaches that do not go away with medicine.  You have vision changes. Document Released: 07/23/2001 Document Revised: 08/03/2013 Document Reviewed: 09/29/2012 [employer] Patient Information 2015 [employer]. This information is not intended to replace advice given to you by your health care provider. Make sure you discuss any questions you have with your health care provider.  

## 2014-04-12 ENCOUNTER — Other Ambulatory Visit: Payer: Medicaid Other

## 2014-04-12 DIAGNOSIS — R7309 Other abnormal glucose: Secondary | ICD-10-CM

## 2014-04-13 ENCOUNTER — Encounter (HOSPITAL_COMMUNITY): Payer: Self-pay | Admitting: Advanced Practice Midwife

## 2014-04-13 LAB — GLUCOSE TOLERANCE, 3 HOURS
GLUCOSE, 1 HOUR-GESTATIONAL: 148 mg/dL (ref 70–189)
Glucose Tolerance, 2 hour: 156 mg/dL (ref 70–164)
Glucose Tolerance, Fasting: 65 mg/dL — ABNORMAL LOW (ref 70–104)
Glucose, GTT - 3 Hour: 95 mg/dL (ref 70–144)

## 2014-04-15 ENCOUNTER — Other Ambulatory Visit: Payer: Self-pay | Admitting: Family

## 2014-04-15 DIAGNOSIS — O09299 Supervision of pregnancy with other poor reproductive or obstetric history, unspecified trimester: Secondary | ICD-10-CM

## 2014-04-20 ENCOUNTER — Ambulatory Visit (HOSPITAL_COMMUNITY)
Admission: RE | Admit: 2014-04-20 | Discharge: 2014-04-20 | Disposition: A | Discharge status: Home / Self Care | Payer: Medicaid Other | Source: Ambulatory Visit | Attending: Family | Admitting: Family

## 2014-04-20 ENCOUNTER — Other Ambulatory Visit: Payer: Self-pay | Admitting: Family

## 2014-04-20 ENCOUNTER — Encounter (HOSPITAL_COMMUNITY): Payer: Self-pay

## 2014-04-20 VITALS — BP 112/60 | HR 90 | Wt 111.0 lb

## 2014-04-20 DIAGNOSIS — O365990 Maternal care for other known or suspected poor fetal growth, unspecified trimester, not applicable or unspecified: Secondary | ICD-10-CM | POA: Diagnosis not present

## 2014-04-20 DIAGNOSIS — O09299 Supervision of pregnancy with other poor reproductive or obstetric history, unspecified trimester: Secondary | ICD-10-CM

## 2014-04-20 DIAGNOSIS — O365931 Maternal care for other known or suspected poor fetal growth, third trimester, fetus 1: Secondary | ICD-10-CM

## 2014-04-21 ENCOUNTER — Encounter: Payer: Self-pay | Admitting: Family

## 2014-04-22 ENCOUNTER — Encounter: Payer: Self-pay | Admitting: Family

## 2014-04-22 DIAGNOSIS — O283 Abnormal ultrasonic finding on antenatal screening of mother: Secondary | ICD-10-CM | POA: Insufficient documentation

## 2014-05-04 ENCOUNTER — Ambulatory Visit (INDEPENDENT_AMBULATORY_CARE_PROVIDER_SITE_OTHER): Payer: Medicaid Other | Admitting: Family

## 2014-05-04 VITALS — BP 109/77 | HR 96 | Temp 98.3°F | Wt 112.8 lb

## 2014-05-04 DIAGNOSIS — O09299 Supervision of pregnancy with other poor reproductive or obstetric history, unspecified trimester: Secondary | ICD-10-CM

## 2014-05-04 DIAGNOSIS — O09293 Supervision of pregnancy with other poor reproductive or obstetric history, third trimester: Secondary | ICD-10-CM

## 2014-05-04 LAB — POCT URINALYSIS DIP (DEVICE)
Bilirubin Urine: NEGATIVE
GLUCOSE, UA: NEGATIVE mg/dL
KETONES UR: NEGATIVE mg/dL
Nitrite: NEGATIVE
PROTEIN: NEGATIVE mg/dL
Specific Gravity, Urine: 1.015 (ref 1.005–1.030)
UROBILINOGEN UA: 1 mg/dL (ref 0.0–1.0)
pH: 6.5 (ref 5.0–8.0)

## 2014-05-04 NOTE — Progress Notes (Signed)
Report bump on vagina x three weeks.  No itching.  Painful to touch.  Vaginal exam > small 3 mm hair follicle on left labia. Lifted hair up.  Advised to avoid shaving.  Apply warm compress.  Large leuks in urine, no UTI symptoms > urine culture sent.

## 2014-05-04 NOTE — Progress Notes (Signed)
C/o of painful bump in vaginal area.

## 2014-05-04 NOTE — Progress Notes (Deleted)
Fetal growth monitored due to history of IUGR; current pregnancy normal growth, lagging

## 2014-05-04 NOTE — Progress Notes (Signed)
Fetal growth monitored due to history of IUGR in previous pregnancy and lagging [facility] with current.  Normal overall growth.  Repeat ultrasound scheduled for 10/1.  Transfer to HR clinic.

## 2014-05-05 LAB — CULTURE, OB URINE
COLONY COUNT: NO GROWTH
ORGANISM ID, BACTERIA: NO GROWTH

## 2014-05-12 ENCOUNTER — Ambulatory Visit (HOSPITAL_COMMUNITY)
Admission: RE | Admit: 2014-05-12 | Discharge: 2014-05-12 | Disposition: A | Discharge status: Home / Self Care | Payer: Medicaid Other | Source: Ambulatory Visit | Attending: Family | Admitting: Family

## 2014-05-12 ENCOUNTER — Other Ambulatory Visit (HOSPITAL_COMMUNITY): Payer: Self-pay | Admitting: Maternal and Fetal Medicine

## 2014-05-12 ENCOUNTER — Encounter (HOSPITAL_COMMUNITY): Payer: Self-pay

## 2014-05-12 VITALS — BP 106/67 | HR 86 | Wt 116.2 lb

## 2014-05-12 DIAGNOSIS — Z3A33 33 weeks gestation of pregnancy: Secondary | ICD-10-CM | POA: Diagnosis not present

## 2014-05-12 DIAGNOSIS — O09293 Supervision of pregnancy with other poor reproductive or obstetric history, third trimester: Secondary | ICD-10-CM | POA: Diagnosis not present

## 2014-05-12 DIAGNOSIS — O365931 Maternal care for other known or suspected poor fetal growth, third trimester, fetus 1: Secondary | ICD-10-CM

## 2014-05-17 ENCOUNTER — Encounter: Payer: Self-pay | Admitting: Family

## 2014-05-18 ENCOUNTER — Encounter: Payer: Self-pay | Admitting: Obstetrics and Gynecology

## 2014-05-18 ENCOUNTER — Ambulatory Visit (INDEPENDENT_AMBULATORY_CARE_PROVIDER_SITE_OTHER): Payer: Medicaid Other | Admitting: Obstetrics and Gynecology

## 2014-05-18 VITALS — BP 109/68 | HR 100 | Temp 97.9°F | Wt 115.4 lb

## 2014-05-18 DIAGNOSIS — Z23 Encounter for immunization: Secondary | ICD-10-CM

## 2014-05-18 DIAGNOSIS — O09293 Supervision of pregnancy with other poor reproductive or obstetric history, third trimester: Secondary | ICD-10-CM

## 2014-05-18 LAB — POCT URINALYSIS DIP (DEVICE)
Bilirubin Urine: NEGATIVE
Glucose, UA: NEGATIVE mg/dL
Hgb urine dipstick: NEGATIVE
Ketones, ur: NEGATIVE mg/dL
NITRITE: NEGATIVE
PH: 6 (ref 5.0–8.0)
Protein, ur: NEGATIVE mg/dL
Specific Gravity, Urine: 1.02 (ref 1.005–1.030)
Urobilinogen, UA: 0.2 mg/dL (ref 0.0–1.0)

## 2014-05-18 NOTE — Progress Notes (Signed)
Growth scan 06/02/14. Flu vaccine today. Again concerned about "bumps on vagina", concerned not noticed with prior pregnancies. No pain, drainage, itch. Exam: indicates mid left labium majorum which has small varicosity> reassured.

## 2014-05-18 NOTE — Progress Notes (Signed)
Pt is concerned about bumps on vagina.   Interpreter present during check in [place] flu vaccine

## 2014-05-18 NOTE — Patient Instructions (Signed)
Third Trimester of Pregnancy The third trimester is from week 29 through week 42, months 7 through 9. The third trimester is a time when the fetus is growing rapidly. At the end of the ninth month, the fetus is about 20 inches in length and weighs 6-10 pounds.  BODY CHANGES Your body goes through many changes during pregnancy. The changes vary from woman to woman.   Your weight will continue to increase. You can expect to gain 25-35 pounds (11-16 kg) by the end of the pregnancy.  You may begin to get stretch marks on your hips, abdomen, and breasts.  You may urinate more often because the fetus is moving lower into your pelvis and pressing on your bladder.  You may develop or continue to have heartburn as a result of your pregnancy.  You may develop constipation because certain hormones are causing the muscles that push waste through your intestines to slow down.  You may develop hemorrhoids or swollen, bulging veins (varicose veins).  You may have pelvic pain because of the weight gain and pregnancy hormones relaxing your joints between the bones in your pelvis. Backaches may result from overexertion of the muscles supporting your posture.  You may have changes in your hair. These can include thickening of your hair, rapid growth, and changes in texture. Some women also have hair loss during or after pregnancy, or hair that feels dry or thin. Your hair will most likely return to normal after your baby is born.  Your breasts will continue to grow and be tender. A yellow discharge may leak from your breasts called colostrum.  Your belly button may stick out.  You may feel short of breath because of your expanding uterus.  You may notice the fetus "dropping," or moving lower in your abdomen.  You may have a bloody mucus discharge. This usually occurs a few days to a week before labor begins.  Your cervix becomes thin and soft (effaced) near your due date. WHAT TO EXPECT AT YOUR PRENATAL  EXAMS  You will have prenatal exams every 2 weeks until week 36. Then, you will have weekly prenatal exams. During a routine prenatal visit:  You will be weighed to make sure you and the fetus are growing normally.  Your blood pressure is taken.  Your abdomen will be measured to track your baby's growth.  The fetal heartbeat will be listened to.  Any test results from the previous visit will be discussed.  You may have a cervical check near your due date to see if you have effaced. At around 36 weeks, your caregiver will check your cervix. At the same time, your caregiver will also perform a test on the secretions of the vaginal tissue. This test is to determine if a type of bacteria, Group B streptococcus, is present. Your caregiver will explain this further. Your caregiver may ask you:  What your birth plan is.  How you are feeling.  If you are feeling the baby move.  If you have had any abnormal symptoms, such as leaking fluid, bleeding, severe headaches, or abdominal cramping.  If you have any questions. Other tests or screenings that may be performed during your third trimester include:  Blood tests that check for low iron levels (anemia).  Fetal testing to check the health, activity level, and growth of the fetus. Testing is done if you have certain medical conditions or if there are problems during the pregnancy. FALSE LABOR You may feel small, irregular contractions that   eventually go away. These are called Braxton Hicks contractions, or false labor. Contractions may last for hours, days, or even weeks before true labor sets in. If contractions come at regular intervals, intensify, or become painful, it is best to be seen by your caregiver.  SIGNS OF LABOR   Menstrual-like cramps.  Contractions that are 5 minutes apart or less.  Contractions that start on the top of the uterus and spread down to the lower abdomen and back.  A sense of increased pelvic pressure or back  pain.  A watery or bloody mucus discharge that comes from the vagina. If you have any of these signs before the 37th week of pregnancy, call your caregiver right away. You need to go to the hospital to get checked immediately. HOME CARE INSTRUCTIONS   Avoid all smoking, herbs, alcohol, and unprescribed drugs. These chemicals affect the formation and growth of the baby.  Follow your caregiver's instructions regarding medicine use. There are medicines that are either safe or unsafe to take during pregnancy.  Exercise only as directed by your caregiver. Experiencing uterine cramps is a good sign to stop exercising.  Continue to eat regular, healthy meals.  Wear a good support bra for breast tenderness.  Do not use hot tubs, steam rooms, or saunas.  Wear your seat belt at all times when driving.  Avoid raw meat, uncooked cheese, cat litter boxes, and soil used by cats. These carry germs that can cause birth defects in the baby.  Take your prenatal vitamins.  Try taking a stool softener (if your caregiver approves) if you develop constipation. Eat more high-fiber foods, such as fresh vegetables or fruit and whole grains. Drink plenty of fluids to keep your urine clear or pale yellow.  Take warm sitz baths to soothe any pain or discomfort caused by hemorrhoids. Use hemorrhoid cream if your caregiver approves.  If you develop varicose veins, wear support hose. Elevate your feet for 15 minutes, 3-4 times a day. Limit salt in your diet.  Avoid heavy lifting, wear low heal shoes, and practice good posture.  Rest a lot with your legs elevated if you have leg cramps or low back pain.  Visit your dentist if you have not gone during your pregnancy. Use a soft toothbrush to brush your teeth and be gentle when you floss.  A sexual relationship may be continued unless your caregiver directs you otherwise.  Do not travel far distances unless it is absolutely necessary and only with the approval  of your caregiver.  Take prenatal classes to understand, practice, and ask questions about the labor and delivery.  Make a trial run to the hospital.  Pack your hospital bag.  Prepare the baby's nursery.  Continue to go to all your prenatal visits as directed by your caregiver. SEEK MEDICAL CARE IF:  You are unsure if you are in labor or if your water has broken.  You have dizziness.  You have mild pelvic cramps, pelvic pressure, or nagging pain in your abdominal area.  You have persistent nausea, vomiting, or diarrhea.  You have a bad smelling vaginal discharge.  You have pain with urination. SEEK IMMEDIATE MEDICAL CARE IF:   You have a fever.  You are leaking fluid from your vagina.  You have spotting or bleeding from your vagina.  You have severe abdominal cramping or pain.  You have rapid weight loss or gain.  You have shortness of breath with chest pain.  You notice sudden or extreme swelling   of your face, hands, ankles, feet, or legs.  You have not felt your baby move in over an hour.  You have severe headaches that do not go away with medicine.  You have vision changes. Document Released: 07/23/2001 Document Revised: 08/03/2013 Document Reviewed: 09/29/2012 [employer] Patient Information 2015 [employer]. This information is not intended to replace advice given to you by your health care provider. Make sure you discuss any questions you have with your health care provider.  

## 2014-05-19 ENCOUNTER — Other Ambulatory Visit (HOSPITAL_COMMUNITY): Payer: Self-pay | Admitting: Maternal and Fetal Medicine

## 2014-05-19 DIAGNOSIS — O09293 Supervision of pregnancy with other poor reproductive or obstetric history, third trimester: Secondary | ICD-10-CM

## 2014-06-02 ENCOUNTER — Encounter (HOSPITAL_COMMUNITY): Payer: Self-pay

## 2014-06-02 ENCOUNTER — Ambulatory Visit (HOSPITAL_COMMUNITY)
Admission: RE | Admit: 2014-06-02 | Discharge: 2014-06-02 | Disposition: A | Discharge status: Home / Self Care | Payer: Medicaid Other | Source: Ambulatory Visit | Attending: Maternal and Fetal Medicine | Admitting: Maternal and Fetal Medicine

## 2014-06-02 VITALS — BP 106/75 | HR 88 | Wt 113.8 lb

## 2014-06-02 DIAGNOSIS — Z3A36 36 weeks gestation of pregnancy: Secondary | ICD-10-CM | POA: Diagnosis not present

## 2014-06-02 DIAGNOSIS — O365930 Maternal care for other known or suspected poor fetal growth, third trimester, not applicable or unspecified: Secondary | ICD-10-CM | POA: Diagnosis not present

## 2014-06-02 DIAGNOSIS — O09293 Supervision of pregnancy with other poor reproductive or obstetric history, third trimester: Secondary | ICD-10-CM | POA: Diagnosis not present

## 2014-06-02 DIAGNOSIS — O365910 Maternal care for other known or suspected poor fetal growth, first trimester, not applicable or unspecified: Secondary | ICD-10-CM

## 2014-06-02 NOTE — ED Notes (Signed)
Interpreter Georga BoraLay Sha Mu with patient today.

## 2014-06-03 ENCOUNTER — Encounter: Payer: Self-pay | Admitting: Advanced Practice Midwife

## 2014-06-03 ENCOUNTER — Ambulatory Visit (INDEPENDENT_AMBULATORY_CARE_PROVIDER_SITE_OTHER): Payer: Medicaid Other | Admitting: Advanced Practice Midwife

## 2014-06-03 VITALS — BP 109/68 | HR 90 | Temp 97.8°F | Wt 115.1 lb

## 2014-06-03 DIAGNOSIS — Z3493 Encounter for supervision of normal pregnancy, unspecified, third trimester: Secondary | ICD-10-CM

## 2014-06-03 DIAGNOSIS — IMO0002 Reserved for concepts with insufficient information to code with codable children: Secondary | ICD-10-CM

## 2014-06-03 DIAGNOSIS — O09293 Supervision of pregnancy with other poor reproductive or obstetric history, third trimester: Secondary | ICD-10-CM

## 2014-06-03 LAB — OB RESULTS CONSOLE GC/CHLAMYDIA
Chlamydia: NEGATIVE
GC PROBE AMP, GENITAL: NEGATIVE

## 2014-06-03 LAB — POCT URINALYSIS DIP (DEVICE)
Bilirubin Urine: NEGATIVE
GLUCOSE, UA: NEGATIVE mg/dL
Hgb urine dipstick: NEGATIVE
Ketones, ur: NEGATIVE mg/dL
Nitrite: NEGATIVE
PROTEIN: NEGATIVE mg/dL
SPECIFIC GRAVITY, URINE: 1.02 (ref 1.005–1.030)
UROBILINOGEN UA: 0.2 mg/dL (ref 0.0–1.0)
pH: 7.5 (ref 5.0–8.0)

## 2014-06-03 LAB — OB RESULTS CONSOLE GBS: STREP GROUP B AG: NEGATIVE

## 2014-06-03 NOTE — Patient Instructions (Signed)
Braxton Hicks Contractions Contractions of the uterus can occur throughout pregnancy. Contractions are not always a sign that you are in labor.  WHAT ARE BRAXTON HICKS CONTRACTIONS?  Contractions that occur before labor are called Braxton Hicks contractions, or false labor. Toward the end of pregnancy (32-34 weeks), these contractions can develop more often and may become more forceful. This is not true labor because these contractions do not result in opening (dilatation) and thinning of the cervix. They are sometimes difficult to tell apart from true labor because these contractions can be forceful and people have different pain tolerances. You should not feel embarrassed if you go to the hospital with false labor. Sometimes, the only way to tell if you are in true labor is for your health care provider to look for changes in the cervix. If there are no prenatal problems or other health problems associated with the pregnancy, it is completely safe to be sent home with false labor and await the onset of true labor. HOW CAN YOU TELL THE DIFFERENCE BETWEEN TRUE AND FALSE LABOR? False Labor  The contractions of false labor are usually shorter and not as hard as those of true labor.   The contractions are usually irregular.   The contractions are often felt in the front of the lower abdomen and in the groin.   The contractions may go away when you walk around or change positions while lying down.   The contractions get weaker and are shorter lasting as time goes on.   The contractions do not usually become progressively stronger, regular, and closer together as with true labor.  True Labor  Contractions in true labor last 30-70 seconds, become very regular, usually become more intense, and increase in frequency.   The contractions do not go away with walking.   The discomfort is usually felt in the top of the uterus and spreads to the lower abdomen and low back.   True labor can be  determined by your health care provider with an exam. This will show that the cervix is dilating and getting thinner.  WHAT TO REMEMBER  Keep up with your usual exercises and follow other instructions given by your health care provider.   Take medicines as directed by your health care provider.   Keep your regular prenatal appointments.   Eat and drink lightly if you think you are going into labor.   If Braxton Hicks contractions are making you uncomfortable:   Change your position from lying down or resting to walking, or from walking to resting.   Sit and rest in a tub of warm water.   Drink 2-3 glasses of water. Dehydration may cause these contractions.   Do slow and deep breathing several times an hour.  WHEN SHOULD I SEEK IMMEDIATE MEDICAL CARE? Seek immediate medical care if:  Your contractions become stronger, more regular, and closer together.   You have fluid leaking or gushing from your vagina.   You have a fever.   You pass blood-tinged mucus.   You have vaginal bleeding.   You have continuous abdominal pain.   You have low back pain that you never had before.   You feel your baby's head pushing down and causing pelvic pressure.   Your baby is not moving as much as it used to.  Document Released: 07/29/2005 Document Revised: 08/03/2013 Document Reviewed: 05/10/2013 [employer] Patient Information 2015 [employer]. This information is not intended to replace advice given to you by your health care   provider. Make sure you discuss any questions you have with your health care provider.  Fetal Movement Counts Patient Name: __________________________________________________ Patient Due Date: ____________________ Performing a fetal movement count is highly recommended in high-risk pregnancies, but it is good for every pregnant woman to do. Your health care provider may ask you to start counting fetal movements at 28 weeks of the pregnancy. Fetal  movements often increase:  After eating a full meal.  After physical activity.  After eating or drinking something sweet or cold.  At rest. Pay attention to when you feel the baby is most active. This will help you notice a pattern of your baby's sleep and wake cycles and what factors contribute to an increase in fetal movement. It is important to perform a fetal movement count at the same time each day when your baby is normally most active.  HOW TO COUNT FETAL MOVEMENTS 1. Find a quiet and comfortable area to sit or lie down on your left side. Lying on your left side provides the best blood and oxygen circulation to your baby. 2. Write down the day and time on a sheet of paper or in a journal. 3. Start counting kicks, flutters, swishes, rolls, or jabs in a 2-hour period. You should feel at least 10 movements within 2 hours. 4. If you do not feel 10 movements in 2 hours, wait 2-3 hours and count again. Look for a change in the pattern or not enough counts in 2 hours. SEEK MEDICAL CARE IF:  You feel less than 10 counts in 2 hours, tried twice.  There is no movement in over an hour.  The pattern is changing or taking longer each day to reach 10 counts in 2 hours.  You feel the baby is not moving as he or she usually does. Date: ____________ Movements: ____________ Start time: ____________ Finish time: ____________  Date: ____________ Movements: ____________ Start time: ____________ Finish time: ____________ Date: ____________ Movements: ____________ Start time: ____________ Finish time: ____________ Date: ____________ Movements: ____________ Start time: ____________ Finish time: ____________ Date: ____________ Movements: ____________ Start time: ____________ Finish time: ____________ Date: ____________ Movements: ____________ Start time: ____________ Finish time: ____________ Date: ____________ Movements: ____________ Start time: ____________ Finish time: ____________ Date: ____________  Movements: ____________ Start time: ____________ Finish time: ____________  Date: ____________ Movements: ____________ Start time: ____________ Finish time: ____________ Date: ____________ Movements: ____________ Start time: ____________ Finish time: ____________ Date: ____________ Movements: ____________ Start time: ____________ Finish time: ____________ Date: ____________ Movements: ____________ Start time: ____________ Finish time: ____________ Date: ____________ Movements: ____________ Start time: ____________ Finish time: ____________ Date: ____________ Movements: ____________ Start time: ____________ Finish time: ____________ Date: ____________ Movements: ____________ Start time: ____________ Finish time: ____________  Date: ____________ Movements: ____________ Start time: ____________ Finish time: ____________ Date: ____________ Movements: ____________ Start time: ____________ Finish time: ____________ Date: ____________ Movements: ____________ Start time: ____________ Finish time: ____________ Date: ____________ Movements: ____________ Start time: ____________ Finish time: ____________ Date: ____________ Movements: ____________ Start time: ____________ Finish time: ____________ Date: ____________ Movements: ____________ Start time: ____________ Finish time: ____________ Date: ____________ Movements: ____________ Start time: ____________ Finish time: ____________  Date: ____________ Movements: ____________ Start time: ____________ Finish time: ____________ Date: ____________ Movements: ____________ Start time: ____________ Finish time: ____________ Date: ____________ Movements: ____________ Start time: ____________ Finish time: ____________ Date: ____________ Movements: ____________ Start time: ____________ Finish time: ____________ Date: ____________ Movements: ____________ Start time: ____________ Finish time: ____________ Date: ____________ Movements: ____________ Start time:  ____________ Finish time: ____________ Date: ____________ Movements:   ____________ Start time: ____________ Finish time: ____________  Date: ____________ Movements: ____________ Start time: ____________ Finish time: ____________ Date: ____________ Movements: ____________ Start time: ____________ Finish time: ____________ Date: ____________ Movements: ____________ Start time: ____________ Finish time: ____________ Date: ____________ Movements: ____________ Start time: ____________ Finish time: ____________ Date: ____________ Movements: ____________ Start time: ____________ Finish time: ____________ Date: ____________ Movements: ____________ Start time: ____________ Finish time: ____________ Date: ____________ Movements: ____________ Start time: ____________ Finish time: ____________  Date: ____________ Movements: ____________ Start time: ____________ Finish time: ____________ Date: ____________ Movements: ____________ Start time: ____________ Finish time: ____________ Date: ____________ Movements: ____________ Start time: ____________ Finish time: ____________ Date: ____________ Movements: ____________ Start time: ____________ Finish time: ____________ Date: ____________ Movements: ____________ Start time: ____________ Finish time: ____________ Date: ____________ Movements: ____________ Start time: ____________ Finish time: ____________ Date: ____________ Movements: ____________ Start time: ____________ Finish time: ____________  Date: ____________ Movements: ____________ Start time: ____________ Finish time: ____________ Date: ____________ Movements: ____________ Start time: ____________ Finish time: ____________ Date: ____________ Movements: ____________ Start time: ____________ Finish time: ____________ Date: ____________ Movements: ____________ Start time: ____________ Finish time: ____________ Date: ____________ Movements: ____________ Start time: ____________ Finish time: ____________ Date:  ____________ Movements: ____________ Start time: ____________ Finish time: ____________ Date: ____________ Movements: ____________ Start time: ____________ Finish time: ____________  Date: ____________ Movements: ____________ Start time: ____________ Finish time: ____________ Date: ____________ Movements: ____________ Start time: ____________ Finish time: ____________ Date: ____________ Movements: ____________ Start time: ____________ Finish time: ____________ Date: ____________ Movements: ____________ Start time: ____________ Finish time: ____________ Date: ____________ Movements: ____________ Start time: ____________ Finish time: ____________ Date: ____________ Movements: ____________ Start time: ____________ Finish time: ____________ Document Released: 08/28/2006 Document Revised: 12/13/2013 Document Reviewed: 05/25/2012 Exit[employer] Patient Information 2015 [employer]. This information is not intended to replace advice given to you by your health care provider. Make sure you discuss any questions you have with your health care provider.  

## 2014-06-03 NOTE — Progress Notes (Signed)
Cultures today 

## 2014-06-03 NOTE — Progress Notes (Signed)
EFW 12% AC 14%. F/U as clinically indicated per MFM. GBS, cultures today. Discussed Hx of not knowing she was in labor w/ first baby until she came in and immediately gwve birth. Second labro IOL. Worried she won;t knwo when she is in labor. Discussed palpating UC's and coming to [facility] as soon as she thinks she may be in labor.

## 2014-06-04 LAB — GC/CHLAMYDIA PROBE AMP
CT Probe RNA: NEGATIVE
GC Probe RNA: NEGATIVE

## 2014-06-05 LAB — CULTURE, BETA STREP (GROUP B ONLY)

## 2014-06-07 ENCOUNTER — Encounter: Payer: Self-pay | Admitting: Advanced Practice Midwife

## 2014-06-07 DIAGNOSIS — Z3493 Encounter for supervision of normal pregnancy, unspecified, third trimester: Secondary | ICD-10-CM | POA: Insufficient documentation

## 2014-06-10 ENCOUNTER — Ambulatory Visit (INDEPENDENT_AMBULATORY_CARE_PROVIDER_SITE_OTHER): Payer: Medicaid Other | Admitting: Family Medicine

## 2014-06-10 VITALS — BP 109/70 | HR 91 | Temp 97.7°F | Wt 117.3 lb

## 2014-06-10 DIAGNOSIS — Z3493 Encounter for supervision of normal pregnancy, unspecified, third trimester: Secondary | ICD-10-CM

## 2014-06-10 LAB — POCT URINALYSIS DIP (DEVICE)
Bilirubin Urine: NEGATIVE
GLUCOSE, UA: NEGATIVE mg/dL
HGB URINE DIPSTICK: NEGATIVE
Ketones, ur: NEGATIVE mg/dL
Nitrite: NEGATIVE
Protein, ur: NEGATIVE mg/dL
SPECIFIC GRAVITY, URINE: 1.015 (ref 1.005–1.030)
Urobilinogen, UA: 0.2 mg/dL (ref 0.0–1.0)
pH: 7 (ref 5.0–8.0)

## 2014-06-10 NOTE — Patient Instructions (Signed)
Third Trimester of Pregnancy The third trimester is from week 29 through week 42, months 7 through 9. This trimester is when your unborn baby (fetus) is growing very fast. At the end of the ninth month, the unborn baby is about 20 inches in length. It weighs about 6-10 pounds.  HOME CARE   Avoid all smoking, herbs, and alcohol. Avoid drugs not approved by your doctor.  Only take medicine as told by your doctor. Some medicines are safe and some are not during pregnancy.  Exercise only as told by your doctor. Stop exercising if you start having cramps.  Eat regular, healthy meals.  Wear a good support bra if your breasts are tender.  Do not use hot tubs, steam rooms, or saunas.  Wear your seat belt when driving.  Avoid raw meat, uncooked cheese, and liter boxes and soil used by cats.  Take your prenatal vitamins.  Try taking medicine that helps you poop (stool softener) as needed, and if your doctor approves. Eat more fiber by eating fresh fruit, vegetables, and whole grains. Drink enough fluids to keep your pee (urine) clear or pale yellow.  Take warm water baths (sitz baths) to soothe pain or discomfort caused by hemorrhoids. Use hemorrhoid cream if your doctor approves.  If you have puffy, bulging veins (varicose veins), wear support hose. Raise (elevate) your feet for 15 minutes, 3-4 times a day. Limit salt in your diet.  Avoid heavy lifting, wear low heels, and sit up straight.  Rest with your legs raised if you have leg cramps or low back pain.  Visit your dentist if you have not gone during your pregnancy. Use a soft toothbrush to brush your teeth. Be gentle when you floss.  You can have sex (intercourse) unless your doctor tells you not to.  Do not travel far distances unless you must. Only do so with your doctor's approval.  Take prenatal classes.  Practice driving to the hospital.  Pack your hospital bag.  Prepare the baby's room.  Go to your doctor visits. GET  HELP IF:  You are not sure if you are in labor or if your water has broken.  You are dizzy.  You have mild cramps or pressure in your lower belly (abdominal).  You have a nagging pain in your belly area.  You continue to feel sick to your stomach (nauseous), throw up (vomit), or have watery poop (diarrhea).  You have bad smelling fluid coming from your vagina.  You have pain with peeing (urination). GET HELP RIGHT AWAY IF:   You have a fever.  You are leaking fluid from your vagina.  You are spotting or bleeding from your vagina.  You have severe belly cramping or pain.  You lose or gain weight rapidly.  You have trouble catching your breath and have chest pain.  You notice sudden or extreme puffiness (swelling) of your face, hands, ankles, feet, or legs.  You have not felt the baby move in over an hour.  You have severe headaches that do not go away with medicine.  You have vision changes. Document Released: 10/23/2009 Document Revised: 11/23/2012 Document Reviewed: 09/29/2012 ExitCare Patient Information 2015 [employer]. This information is not intended to replace advice given to you by your health care provider. Make sure you discuss any questions you have with your health care provider.  

## 2014-06-10 NOTE — Progress Notes (Signed)
Patient without complaints.  Denies vaginal bleeding, abnormal vaginal discharge, contractions, loss of fluid.  Reports good fetal activity.  Labor precautions reviewed.  Follow up in 1 weeks.  

## 2014-06-12 LAB — URINE CULTURE
Colony Count: NO GROWTH
ORGANISM ID, BACTERIA: NO GROWTH

## 2014-06-13 ENCOUNTER — Encounter: Payer: Self-pay | Admitting: Advanced Practice Midwife

## 2014-06-15 ENCOUNTER — Ambulatory Visit (INDEPENDENT_AMBULATORY_CARE_PROVIDER_SITE_OTHER): Payer: Medicaid Other | Admitting: Advanced Practice Midwife

## 2014-06-15 VITALS — BP 106/61 | HR 97 | Temp 98.2°F | Wt 118.2 lb

## 2014-06-15 DIAGNOSIS — Z3493 Encounter for supervision of normal pregnancy, unspecified, third trimester: Secondary | ICD-10-CM

## 2014-06-15 LAB — POCT URINALYSIS DIP (DEVICE)
Bilirubin Urine: NEGATIVE
Glucose, UA: NEGATIVE mg/dL
KETONES UR: NEGATIVE mg/dL
Nitrite: NEGATIVE
PROTEIN: NEGATIVE mg/dL
SPECIFIC GRAVITY, URINE: 1.025 (ref 1.005–1.030)
Urobilinogen, UA: 0.2 mg/dL (ref 0.0–1.0)
pH: 7 (ref 5.0–8.0)

## 2014-06-15 NOTE — Patient Instructions (Signed)

## 2014-06-15 NOTE — Progress Notes (Signed)
Interpretor [phone]#[id number] used. Doing well. No labor, but irregular contractions.

## 2014-06-22 ENCOUNTER — Encounter: Payer: Self-pay | Admitting: Obstetrics and Gynecology

## 2014-06-22 ENCOUNTER — Encounter: Payer: Self-pay | Admitting: *Deleted

## 2014-06-22 ENCOUNTER — Ambulatory Visit (INDEPENDENT_AMBULATORY_CARE_PROVIDER_SITE_OTHER): Payer: Medicaid Other | Admitting: Obstetrics and Gynecology

## 2014-06-22 VITALS — BP 101/78 | HR 77 | Temp 98.4°F | Wt 117.4 lb

## 2014-06-22 DIAGNOSIS — Z3493 Encounter for supervision of normal pregnancy, unspecified, third trimester: Secondary | ICD-10-CM

## 2014-06-22 MED ORDER — OXYCODONE-ACETAMINOPHEN 5-325 MG PO TABS: 1.0000 | ORAL_TABLET | ORAL | Status: DC | PRN

## 2014-06-22 NOTE — Progress Notes (Signed)
Interpreter here. Right sided dental pain, thinks wisdom teeth coming in. No fever.Tylenol ineffective for pain. Cannot fully open mouth for exam. Rx Percocet and dental referral asap. Reviewed plans, labor s/sx.

## 2014-06-22 NOTE — Patient Instructions (Signed)
Dental Pain A tooth ache may be caused by cavities (tooth decay). Cavities expose the nerve of the tooth to air and hot or cold temperatures. It may come from an infection or abscess (also called a boil or furuncle) around your tooth. It is also often caused by dental caries (tooth decay). This causes the pain you are having. DIAGNOSIS  Your caregiver can diagnose this problem by exam. TREATMENT   If caused by an infection, it may be treated with medications which kill germs (antibiotics) and pain medications as prescribed by your caregiver. Take medications as directed.  Only take over-the-counter or prescription medicines for pain, discomfort, or fever as directed by your caregiver.  Whether the tooth ache today is caused by infection or dental disease, you should see your dentist as soon as possible for further care. SEEK MEDICAL CARE IF: The exam and treatment you received today has been provided on an emergency basis only. This is not a substitute for complete medical or dental care. If your problem worsens or new problems (symptoms) appear, and you are unable to meet with your dentist, call or return to this location. SEEK IMMEDIATE MEDICAL CARE IF:   You have a fever.  You develop redness and swelling of your face, jaw, or neck.  You are unable to open your mouth.  You have severe pain uncontrolled by pain medicine. MAKE SURE YOU:   Understand these instructions.  Will watch your condition.  Will get help right away if you are not doing well or get worse. Document Released: 07/29/2005 Document Revised: 10/21/2011 Document Reviewed: 03/16/2008 [facility] Patient Information 2015 [place], [place]. This information is not intended to replace advice given to you by your health care provider. Make sure you discuss any questions you have with your health care provider. Vaginal Delivery During delivery, your health care provider will help you give birth to your baby. During a vaginal  delivery, you will work to push the baby out of your vagina. However, before you can push your baby out, a few things need to happen. The opening of your uterus (cervix) has to soften, thin out, and open up (dilate) all the way to 10 cm. Also, your baby has to move down from the uterus into your vagina.  SIGNS OF LABOR  Your health care provider will first need to make sure you are in labor. Signs of labor include:   Passing what is called the mucous plug before labor begins. This is a small amount of blood-stained mucus.  Having regular, painful uterine contractions.   The time between contractions gets shorter.   The discomfort and pain gradually get more intense.  Contraction pains get worse when walking and do not go away when resting.   Your cervix becomes thinner (effacement) and dilates. BEFORE THE DELIVERY Once you are in labor and admitted into the hospital or care center, your health care provider may do the following:   Perform a complete physical exam.  Review any complications related to pregnancy or labor.  Check your blood pressure, pulse, temperature, and heart rate (vital signs).   Determine if, and when, the rupture of amniotic membranes occurred.  Do a vaginal exam (using a sterile glove and lubricant) to determine:   The position (presentation) of the baby. Is the baby's head presenting first (vertex) in the birth canal (vagina), or are the feet or buttocks first (breech)?   The level (station) of the baby's head within the birth canal.   The effacement  and dilatation of the cervix.   An electronic fetal monitor is usually placed on your abdomen when you first arrive. This is used to monitor your contractions and the baby's heart rate.  When the monitor is on your abdomen (external fetal monitor), it can only pick up the frequency and length of your contractions. It cannot tell the strength of your contractions.  If it becomes necessary for your  health care provider to know exactly how strong your contractions are or to see exactly what the baby's heart rate is doing, an internal monitor may be inserted into your vagina and uterus. Your health care provider will discuss the benefits and risks of using an internal monitor and obtain your permission before inserting the device.  Continuous fetal monitoring may be needed if you have an epidural, are receiving certain medicines (such as oxytocin), or have pregnancy or labor complications.  An IV access tube may be placed into a vein in your arm to deliver fluids and medicines if necessary. THREE STAGES OF LABOR AND DELIVERY Normal labor and delivery is divided into three stages. First Stage This stage starts when you begin to contract regularly and your cervix begins to efface and dilate. It ends when your cervix is completely open (fully dilated). The first stage is the longest stage of labor and can last from 3 hours to 15 hours.  Several methods are available to help with labor pain. You and your health care provider will decide which option is best for you. Options include:   Opioid medicines. These are strong pain medicines that you can get through your IV tube or as a shot into your muscle. These medicines lessen pain but do not make it go away completely.  Epidural. A medicine is given through a thin tube that is inserted in your back. The medicine numbs the lower part of your body and prevents any pain in that area.  Paracervical pain medicine. This is an injection of an anesthetic on each side of your cervix.   You may request natural childbirth, which does not involve the use of pain medicines or an epidural during labor and delivery. Instead, you will use other things, such as breathing exercises, to help cope with the pain. Second Stage The second stage of labor begins when your cervix is fully dilated at 10 cm. It continues until you push your baby down through the birth canal  and the baby is born. This stage can take only minutes or several hours.  The location of your baby's head as it moves through the birth canal is reported as a number called a station. If the baby's head has not started its descent, the station is described as being at minus 3 (-3). When your baby's head is at the zero station, it is at the middle of the birth canal and is engaged in the pelvis. The station of your baby helps indicate the progress of the second stage of labor.  When your baby is born, your health care provider may hold the baby with his or her head lowered to prevent amniotic fluid, mucus, and blood from getting into the baby's lungs. The baby's mouth and nose may be suctioned with a small bulb syringe to remove any additional fluid.  Your health care provider may then place the baby on your stomach. It is important to keep the baby from getting cold. To do this, the health care provider will dry the baby off, place the baby directly  on your skin (with no blankets between you and the baby), and cover the baby with warm, dry blankets.   The umbilical cord is cut. Third Stage During the third stage of labor, your health care provider will deliver the placenta (afterbirth) and make sure your bleeding is under control. The delivery of the placenta usually takes about 5 minutes but can take up to 30 minutes. After the placenta is delivered, a medicine may be given either by IV or injection to help contract the uterus and control bleeding. If you are planning to breastfeed, you can try to do so now. After you deliver the placenta, your uterus should contract and get very firm. If your uterus does not remain firm, your health care provider will massage it. This is important because the contraction of the uterus helps cut off bleeding at the site where the placenta was attached to your uterus. If your uterus does not contract properly and stay firm, you may continue to bleed heavily. If there is  a lot of bleeding, medicines may be given to contract the uterus and stop the bleeding.  Document Released: 05/07/2008 Document Revised: 12/13/2013 Document Reviewed: 01/17/2013 [facility] Patient Information 2015 [place]. This information is not intended to replace advice given to you by your health care provider. Make sure you discuss any questions you have with your health care provider.

## 2014-06-22 NOTE — Progress Notes (Signed)
Heather Villa present for interpreter

## 2014-06-29 ENCOUNTER — Encounter: Payer: Self-pay | Admitting: Physician Assistant

## 2014-06-29 ENCOUNTER — Ambulatory Visit (INDEPENDENT_AMBULATORY_CARE_PROVIDER_SITE_OTHER): Payer: Medicaid Other | Admitting: Physician Assistant

## 2014-06-29 VITALS — BP 110/58 | HR 84 | Temp 98.3°F | Wt 117.2 lb

## 2014-06-29 DIAGNOSIS — O09299 Supervision of pregnancy with other poor reproductive or obstetric history, unspecified trimester: Secondary | ICD-10-CM

## 2014-06-29 DIAGNOSIS — R7302 Impaired glucose tolerance (oral): Secondary | ICD-10-CM

## 2014-06-29 DIAGNOSIS — Z603 Acculturation difficulty: Secondary | ICD-10-CM

## 2014-06-29 DIAGNOSIS — Z3483 Encounter for supervision of other normal pregnancy, third trimester: Secondary | ICD-10-CM

## 2014-06-29 DIAGNOSIS — Z3493 Encounter for supervision of normal pregnancy, unspecified, third trimester: Secondary | ICD-10-CM

## 2014-06-29 LAB — POCT URINALYSIS DIP (DEVICE)
BILIRUBIN URINE: NEGATIVE
GLUCOSE, UA: NEGATIVE mg/dL
Hgb urine dipstick: NEGATIVE
KETONES UR: NEGATIVE mg/dL
NITRITE: NEGATIVE
Protein, ur: NEGATIVE mg/dL
Specific Gravity, Urine: 1.02 (ref 1.005–1.030)
Urobilinogen, UA: 0.2 mg/dL (ref 0.0–1.0)
pH: 7 (ref 5.0–8.0)

## 2014-06-29 NOTE — Progress Notes (Signed)
Used interpreter [employer]. C/o not eating good because tooth hurts.

## 2014-06-29 NOTE — Patient Instructions (Signed)
Third Trimester of Pregnancy The third trimester is from week 29 through week 42, months 7 through 9. The third trimester is a time when the fetus is growing rapidly. At the end of the ninth month, the fetus is about 20 inches in length and weighs 6-10 pounds.  BODY CHANGES Your body goes through many changes during pregnancy. The changes vary from woman to woman.   Your weight will continue to increase. You can expect to gain 25-35 pounds (11-16 kg) by the end of the pregnancy.  You may begin to get stretch marks on your hips, abdomen, and breasts.  You may urinate more often because the fetus is moving lower into your pelvis and pressing on your bladder.  You may develop or continue to have heartburn as a result of your pregnancy.  You may develop constipation because certain hormones are causing the muscles that push waste through your intestines to slow down.  You may develop hemorrhoids or swollen, bulging veins (varicose veins).  You may have pelvic pain because of the weight gain and pregnancy hormones relaxing your joints between the bones in your pelvis. Backaches may result from overexertion of the muscles supporting your posture.  You may have changes in your hair. These can include thickening of your hair, rapid growth, and changes in texture. Some women also have hair loss during or after pregnancy, or hair that feels dry or thin. Your hair will most likely return to normal after your baby is born.  Your breasts will continue to grow and be tender. A yellow discharge may leak from your breasts called colostrum.  Your belly button may stick out.  You may feel short of breath because of your expanding uterus.  You may notice the fetus "dropping," or moving lower in your abdomen.  You may have a bloody mucus discharge. This usually occurs a few days to a week before labor begins.  Your cervix becomes thin and soft (effaced) near your due date. WHAT TO EXPECT AT YOUR PRENATAL  EXAMS  You will have prenatal exams every 2 weeks until week 36. Then, you will have weekly prenatal exams. During a routine prenatal visit:  You will be weighed to make sure you and the fetus are growing normally.  Your blood pressure is taken.  Your abdomen will be measured to track your baby's growth.  The fetal heartbeat will be listened to.  Any test results from the previous visit will be discussed.  You may have a cervical check near your due date to see if you have effaced. At around 36 weeks, your caregiver will check your cervix. At the same time, your caregiver will also perform a test on the secretions of the vaginal tissue. This test is to determine if a type of bacteria, Group B streptococcus, is present. Your caregiver will explain this further. Your caregiver may ask you:  What your birth plan is.  How you are feeling.  If you are feeling the baby move.  If you have had any abnormal symptoms, such as leaking fluid, bleeding, severe headaches, or abdominal cramping.  If you have any questions. Other tests or screenings that may be performed during your third trimester include:  Blood tests that check for low iron levels (anemia).  Fetal testing to check the health, activity level, and growth of the fetus. Testing is done if you have certain medical conditions or if there are problems during the pregnancy. FALSE LABOR You may feel small, irregular contractions that   eventually go away. These are called Braxton Hicks contractions, or false labor. Contractions may last for hours, days, or even weeks before true labor sets in. If contractions come at regular intervals, intensify, or become painful, it is best to be seen by your caregiver.  SIGNS OF LABOR   Menstrual-like cramps.  Contractions that are 5 minutes apart or less.  Contractions that start on the top of the uterus and spread down to the lower abdomen and back.  A sense of increased pelvic pressure or back  pain.  A watery or bloody mucus discharge that comes from the vagina. If you have any of these signs before the 37th week of pregnancy, call your caregiver right away. You need to go to the hospital to get checked immediately. HOME CARE INSTRUCTIONS   Avoid all smoking, herbs, alcohol, and unprescribed drugs. These chemicals affect the formation and growth of the baby.  Follow your caregiver's instructions regarding medicine use. There are medicines that are either safe or unsafe to take during pregnancy.  Exercise only as directed by your caregiver. Experiencing uterine cramps is a good sign to stop exercising.  Continue to eat regular, healthy meals.  Wear a good support bra for breast tenderness.  Do not use hot tubs, steam rooms, or saunas.  Wear your seat belt at all times when driving.  Avoid raw meat, uncooked cheese, cat litter boxes, and soil used by cats. These carry germs that can cause birth defects in the baby.  Take your prenatal vitamins.  Try taking a stool softener (if your caregiver approves) if you develop constipation. Eat more high-fiber foods, such as fresh vegetables or fruit and whole grains. Drink plenty of fluids to keep your urine clear or pale yellow.  Take warm sitz baths to soothe any pain or discomfort caused by hemorrhoids. Use hemorrhoid cream if your caregiver approves.  If you develop varicose veins, wear support hose. Elevate your feet for 15 minutes, 3-4 times a day. Limit salt in your diet.  Avoid heavy lifting, wear low heal shoes, and practice good posture.  Rest a lot with your legs elevated if you have leg cramps or low back pain.  Visit your dentist if you have not gone during your pregnancy. Use a soft toothbrush to brush your teeth and be gentle when you floss.  A sexual relationship may be continued unless your caregiver directs you otherwise.  Do not travel far distances unless it is absolutely necessary and only with the approval  of your caregiver.  Take prenatal classes to understand, practice, and ask questions about the labor and delivery.  Make a trial run to the hospital.  Pack your hospital bag.  Prepare the baby's nursery.  Continue to go to all your prenatal visits as directed by your caregiver. SEEK MEDICAL CARE IF:  You are unsure if you are in labor or if your water has broken.  You have dizziness.  You have mild pelvic cramps, pelvic pressure, or nagging pain in your abdominal area.  You have persistent nausea, vomiting, or diarrhea.  You have a bad smelling vaginal discharge.  You have pain with urination. SEEK IMMEDIATE MEDICAL CARE IF:   You have a fever.  You are leaking fluid from your vagina.  You have spotting or bleeding from your vagina.  You have severe abdominal cramping or pain.  You have rapid weight loss or gain.  You have shortness of breath with chest pain.  You notice sudden or extreme swelling   of your face, hands, ankles, feet, or legs.  You have not felt your baby move in over an hour.  You have severe headaches that do not go away with medicine.  You have vision changes. Document Released: 07/23/2001 Document Revised: 08/03/2013 Document Reviewed: 09/29/2012 [employer] Patient Information 2015 [employer]. This information is not intended to replace advice given to you by your health care provider. Make sure you discuss any questions you have with your health care provider.  

## 2014-06-29 NOTE — Progress Notes (Signed)
40 weeks, stable.  Denies vaginal bleeding, LOF, contractions, dysuria.  Good fetal movement NST was done earlier today.  NST/BPP scheduled for next Monday.  Pt very much does not want induction and declines to schedule it at this time. She states her tooth pain is getting better as a result of the prescription she received last time and she is starting to eat better now.  Advised to see dentist asap.   RTC on Monday for NST/BPP and in 1 week for ROB.  Cont PNV.

## 2014-07-04 ENCOUNTER — Ambulatory Visit (HOSPITAL_COMMUNITY): Payer: Medicaid Other

## 2014-07-04 ENCOUNTER — Inpatient Hospital Stay (HOSPITAL_COMMUNITY)
Admission: AD | Admit: 2014-07-04 | Discharge: 2014-07-05 | DRG: 775 | Disposition: A | Discharge status: Home / Self Care | Payer: Medicaid Other | Source: Ambulatory Visit | Attending: Family Medicine | Admitting: Family Medicine

## 2014-07-04 ENCOUNTER — Encounter (HOSPITAL_COMMUNITY): Payer: Self-pay | Admitting: *Deleted

## 2014-07-04 ENCOUNTER — Other Ambulatory Visit: Payer: Medicaid Other

## 2014-07-04 DIAGNOSIS — O480 Post-term pregnancy: Secondary | ICD-10-CM | POA: Diagnosis present

## 2014-07-04 DIAGNOSIS — Z3483 Encounter for supervision of other normal pregnancy, third trimester: Secondary | ICD-10-CM | POA: Diagnosis present

## 2014-07-04 DIAGNOSIS — Z3A40 40 weeks gestation of pregnancy: Secondary | ICD-10-CM | POA: Diagnosis present

## 2014-07-04 LAB — RPR

## 2014-07-04 LAB — CBC
HCT: 41 % (ref 36.0–46.0)
HEMOGLOBIN: 14.9 g/dL (ref 12.0–15.0)
MCH: 32.6 pg (ref 26.0–34.0)
MCHC: 36.3 g/dL — ABNORMAL HIGH (ref 30.0–36.0)
MCV: 89.7 fL (ref 78.0–100.0)
PLATELETS: 248 10*3/uL (ref 150–400)
RBC: 4.57 MIL/uL (ref 3.87–5.11)
RDW: 13 % (ref 11.5–15.5)
WBC: 15 10*3/uL — AB (ref 4.0–10.5)

## 2014-07-04 LAB — HIV ANTIBODY (ROUTINE TESTING W REFLEX): HIV: NONREACTIVE

## 2014-07-04 MED ORDER — PHENYLEPHRINE 40 MCG/ML (10ML) SYRINGE FOR IV PUSH (FOR BLOOD PRESSURE SUPPORT)
80.0000 ug | PREFILLED_SYRINGE | INTRAVENOUS | Status: DC | PRN
  Filled 2014-07-04: qty 2

## 2014-07-04 MED ORDER — ONDANSETRON HCL 4 MG/2ML IJ SOLN: 4.0000 mg | INTRAMUSCULAR | Status: DC | PRN

## 2014-07-04 MED ORDER — BUTORPHANOL TARTRATE 1 MG/ML IJ SOLN: 1.0000 mg | INTRAMUSCULAR | Status: DC | PRN

## 2014-07-04 MED ORDER — DIPHENHYDRAMINE HCL 25 MG PO CAPS: 25.0000 mg | ORAL_CAPSULE | Freq: Four times a day (QID) | ORAL | Status: DC | PRN

## 2014-07-04 MED ORDER — OXYCODONE-ACETAMINOPHEN 5-325 MG PO TABS: 2.0000 | ORAL_TABLET | ORAL | Status: DC | PRN

## 2014-07-04 MED ORDER — EPHEDRINE 5 MG/ML INJ
10.0000 mg | INTRAVENOUS | Status: DC | PRN
  Filled 2014-07-04: qty 2

## 2014-07-04 MED ORDER — SODIUM CHLORIDE 0.9 % IJ SOLN: 3.0000 mL | INTRAMUSCULAR | Status: DC | PRN

## 2014-07-04 MED ORDER — LACTATED RINGERS IV SOLN
INTRAVENOUS | Status: DC
  Administered 2014-07-04: 125 mL/h via INTRAVENOUS

## 2014-07-04 MED ORDER — SENNOSIDES-DOCUSATE SODIUM 8.6-50 MG PO TABS
2.0000 | ORAL_TABLET | ORAL | Status: DC
  Administered 2014-07-04: 2 via ORAL
  Filled 2014-07-04: qty 2

## 2014-07-04 MED ORDER — WITCH HAZEL-GLYCERIN EX PADS: 1.0000 "application " | MEDICATED_PAD | CUTANEOUS | Status: DC | PRN

## 2014-07-04 MED ORDER — LACTATED RINGERS IV SOLN: 500.0000 mL | Freq: Once | INTRAVENOUS | Status: DC

## 2014-07-04 MED ORDER — FLEET ENEMA 7-19 GM/118ML RE ENEM: 1.0000 | ENEMA | Freq: Every day | RECTAL | Status: DC | PRN

## 2014-07-04 MED ORDER — PRENATAL MULTIVITAMIN CH
1.0000 | ORAL_TABLET | Freq: Every day | ORAL | Status: DC
  Administered 2014-07-05: 1 via ORAL
  Filled 2014-07-04: qty 1

## 2014-07-04 MED ORDER — IBUPROFEN 600 MG PO TABS
600.0000 mg | ORAL_TABLET | Freq: Four times a day (QID) | ORAL | Status: DC
  Administered 2014-07-04 – 2014-07-05 (×4): 600 mg via ORAL
  Filled 2014-07-04 (×4): qty 1

## 2014-07-04 MED ORDER — OXYTOCIN BOLUS FROM INFUSION: 500.0000 mL | INTRAVENOUS | Status: DC

## 2014-07-04 MED ORDER — ZOLPIDEM TARTRATE 5 MG PO TABS: 5.0000 mg | ORAL_TABLET | Freq: Every evening | ORAL | Status: DC | PRN

## 2014-07-04 MED ORDER — LIDOCAINE HCL (PF) 1 % IJ SOLN
30.0000 mL | INTRAMUSCULAR | Status: DC | PRN
  Filled 2014-07-04: qty 30

## 2014-07-04 MED ORDER — ONDANSETRON HCL 4 MG PO TABS: 4.0000 mg | ORAL_TABLET | ORAL | Status: DC | PRN

## 2014-07-04 MED ORDER — SIMETHICONE 80 MG PO CHEW: 80.0000 mg | CHEWABLE_TABLET | ORAL | Status: DC | PRN

## 2014-07-04 MED ORDER — DIPHENHYDRAMINE HCL 50 MG/ML IJ SOLN: 12.5000 mg | INTRAMUSCULAR | Status: DC | PRN

## 2014-07-04 MED ORDER — SODIUM CHLORIDE 0.9 % IV SOLN: 250.0000 mL | INTRAVENOUS | Status: DC | PRN

## 2014-07-04 MED ORDER — LACTATED RINGERS IV SOLN: 500.0000 mL | INTRAVENOUS | Status: DC | PRN

## 2014-07-04 MED ORDER — LANOLIN HYDROUS EX OINT
TOPICAL_OINTMENT | CUTANEOUS | Status: DC | PRN
Start: 2014-07-04 — End: 2014-07-05

## 2014-07-04 MED ORDER — PHENYLEPHRINE 40 MCG/ML (10ML) SYRINGE FOR IV PUSH (FOR BLOOD PRESSURE SUPPORT)
80.0000 ug | PREFILLED_SYRINGE | INTRAVENOUS | Status: DC | PRN
Start: 2014-07-04 — End: 2014-07-04
  Filled 2014-07-04: qty 2

## 2014-07-04 MED ORDER — FENTANYL 2.5 MCG/ML BUPIVACAINE 1/10 % EPIDURAL INFUSION (WH - ANES): 14.0000 mL/h | INTRAMUSCULAR | Status: DC | PRN

## 2014-07-04 MED ORDER — BISACODYL 10 MG RE SUPP: 10.0000 mg | Freq: Every day | RECTAL | Status: DC | PRN

## 2014-07-04 MED ORDER — OXYCODONE-ACETAMINOPHEN 5-325 MG PO TABS: 1.0000 | ORAL_TABLET | ORAL | Status: DC | PRN

## 2014-07-04 MED ORDER — CITRIC ACID-SODIUM CITRATE 334-500 MG/5ML PO SOLN: 30.0000 mL | ORAL | Status: DC | PRN

## 2014-07-04 MED ORDER — OXYTOCIN 40 UNITS IN LACTATED RINGERS INFUSION - SIMPLE MED
62.5000 mL/h | INTRAVENOUS | Status: DC
  Administered 2014-07-04: 62.5 mL/h via INTRAVENOUS
  Filled 2014-07-04: qty 1000

## 2014-07-04 MED ORDER — BENZOCAINE-MENTHOL 20-0.5 % EX AERO: 1.0000 "application " | INHALATION_SPRAY | CUTANEOUS | Status: DC | PRN

## 2014-07-04 MED ORDER — DIBUCAINE 1 % RE OINT: 1.0000 "application " | TOPICAL_OINTMENT | RECTAL | Status: DC | PRN

## 2014-07-04 MED ORDER — OXYTOCIN 40 UNITS IN LACTATED RINGERS INFUSION - SIMPLE MED: 62.5000 mL/h | INTRAVENOUS | Status: DC | PRN

## 2014-07-04 MED ORDER — ACETAMINOPHEN 325 MG PO TABS: 650.0000 mg | ORAL_TABLET | ORAL | Status: DC | PRN

## 2014-07-04 MED ORDER — SODIUM CHLORIDE 0.9 % IJ SOLN: 3.0000 mL | Freq: Two times a day (BID) | INTRAMUSCULAR | Status: DC

## 2014-07-04 MED ORDER — ONDANSETRON HCL 4 MG/2ML IJ SOLN: 4.0000 mg | Freq: Four times a day (QID) | INTRAMUSCULAR | Status: DC | PRN

## 2014-07-04 NOTE — Plan of Care (Signed)
Problem: Phase I Progression Outcomes Goal: Foley catheter patent Outcome: Not Applicable Date Met:  50/56/97 Goal: OOB as tolerated unless otherwise ordered Outcome: Completed/Met Date Met:  07/04/14 Goal: IS, TCDB as ordered Outcome: Completed/Met Date Met:  07/04/14 Goal: VS, stable, temp < 100.4 degrees F Outcome: Completed/Met Date Met:  07/04/14 Goal: Initial discharge plan identified Outcome: Completed/Met Date Met:  07/04/14

## 2014-07-04 NOTE — MAU Note (Signed)
Pt states here for contractions. Denies lof, does have mucus discharge.

## 2014-07-04 NOTE — MAU Note (Signed)
Per Canal WinchesterHayes, RN charge, pt to go to room 164. FM tracing reviewed/discussed.

## 2014-07-04 NOTE — Plan of Care (Signed)
Problem: Phase I Progression Outcomes Goal: Pain controlled with appropriate interventions Outcome: Completed/Met Date Met:  07/04/14 Goal: Voiding adequately Outcome: Completed/Met Date Met:  07/04/14 Goal: Other Phase I Outcomes/Goals Outcome: Completed/Met Date Met:  07/04/14

## 2014-07-04 NOTE — Progress Notes (Signed)
Admitted mom to mother baby, explained and demonstrated call bell, TV, and peri care.  Mother answered back in [place].  Mother asked for "warm water please" to drink.  Stated "I am in no pain right now", when assessed.  FOB is leaving to pick up other children and I instructed mother to call for assistance and I will call interpreter if needed.

## 2014-07-04 NOTE — Lactation Note (Signed)
This note was copied from the [facility]. Lactation Consultation Note Initial visit at  6 hours of age. FOB signed interpreter papers copy in chart.  Mom speaks some english and Burmese.  Baby has already had a few feedings one with a latch score of "10."  Mom is laying in bed with baby swaddled next to her, she reports last feeding about 1  1/2 hours ago.  She reports baby is showing good feeding cues and feels baby is getting a good latch. [facility] resources given and discussed.  Encouraged to feed with early cues on demand.  Early newborn behavior discussed.  Hand expression done per mom  with colostrum visible.  Mom to call for assist as needed.    Patient Name: Girl Debria GarretRose Railsback [id number]'s Date: 07/04/2014 Reason for consult: Initial assessment   Maternal Data Has patient been taught Hand Expression?: Yes Does the patient have breastfeeding experience prior to this delivery?: Yes  Feeding    LATCH Score/Interventions                Intervention(s): Breastfeeding basics reviewed     Lactation Tools Discussed/Used     Consult Status Consult Status: Follow-up Date: 07/05/14 Follow-up type: In-patient    Beverely RisenShoptaw, Arvella MerlesJana Lynn 07/04/2014, 5:58 PM

## 2014-07-04 NOTE — H&P (Signed)
LABOR ADMISSION HISTORY AND PHYSICAL  Heather Villa is a 28 y.o. female G3P2002 with IUP at 2256w5d by LMP presenting  In active labor. She request nexplanon for birth control. Wants to breast feed.  Dating: By LMP --->  Estimated Date of Delivery: 06/29/14   Prenatal History/Complications:  Past Medical History: Past Medical History  Diagnosis Date  . Medical history non-contributory     Past Surgical History: Past Surgical History  Procedure Laterality Date  . No past surgeries      Obstetrical History: OB History    Gravida Para Term Preterm AB TAB SAB Ectopic Multiple Living   3 2 2  0 0 0 0 0 0 2      Social History: History   Social History  . Marital Status: Married    Spouse Name: N/A    Number of Children: N/A  . Years of Education: N/A   Social History Main Topics  . Smoking status: Never Smoker   . Smokeless tobacco: Never Used  . Alcohol Use: No  . Drug Use: No  . Sexual Activity: Yes   Other Topics Concern  . None   Social History Narrative    Family History: History reviewed. No pertinent family history.  Allergies: No Known Allergies  Prescriptions prior to admission  Medication Sig Dispense Refill Last Dose  . albuterol (PROVENTIL HFA;VENTOLIN HFA) 108 (90 BASE) MCG/ACT inhaler Inhale 2 puffs into the lungs every 6 (six) hours as needed for wheezing or shortness of breath. 1 Inhaler 0 Not Taking  . oxyCODONE-acetaminophen (PERCOCET/ROXICET) 5-325 MG per tablet Take 1 tablet by mouth every 4 (four) hours as needed. 20 tablet 0 Taking  . prenatal vitamin w/FE, FA (PRENATAL 1 + 1) 27-1 MG TABS tablet Take 1 tablet by mouth daily at 12 noon.   Taking     Review of Systems   All systems reviewed and negative except as stated in HPI  Blood pressure 106/69, pulse 80, temperature 98 F (36.7 C), temperature source Oral, resp. rate 16, height 5' (1.524 m), weight 120 lb (54.432 kg), last menstrual period 09/22/2013. General appearance: alert and  cooperative Lungs: clear to auscultation bilaterally Heart: regular rate and rhythm Abdomen: soft, non-tender; bowel sounds normal Extremities: Homans sign is negative, no sign of DVT Presentation: cephalic  Dilation: 6 Effacement (%): 90 Station: -1 Exam by:: Leafy RoKatie koontz, RNC   Prenatal labs: ABO, Rh: A/POS/-- (04/14 0959) Antibody: NEG (04/14 0959) Rubella:   RPR: NON REAC (08/26 1321)  HBsAg: NEGATIVE (04/14 0959)  HIV: NONREACTIVE (08/26 1321)  GBS: Negative (10/23 0000)  1 hr Glucola 147 => 65/148/156/95 Genetic screening  normal Anatomy US SGA => 24 wks AC 7%, overall growth 24%ile > 33 wks AC 11%ile, overall 24%ile  > EFW 36 weeks 12%, AV 14%.  Clinic  [facility]  Dating  LMP, First screen ordered  Genetic Screen 1 Screen:   NT normal       AFP:                    Anatomic US Limited at 19 weeks, rescan normal  GTT Early:  n/a        Third trimester: 147 > 3 hr [phone] (nml)  TDaP vaccine  04/06/14  Flu vaccine  05/18/14  GBS Neg  Contraception  Nexplanon  Baby Food  Breast  Circumcision  female, n/a  Pediatrician  [address] (didn't recall name)  Support Person  Husband  Pap normal but  TZ is absent   Results for orders placed or performed during the hospital encounter of 07/04/14 (from the past 24 hour(s))  CBC   Collection Time: 07/04/14  9:19 AM  Result Value Ref Range   WBC 15.0 (H) 4.0 - 10.5 K/uL   RBC 4.57 3.87 - 5.11 MIL/uL   Hemoglobin 14.9 12.0 - 15.0 g/dL   HCT 09.841.0 11.936.0 - 14.746.0 %   MCV 89.7 78.0 - 100.0 fL   MCH 32.6 26.0 - 34.0 pg   MCHC 36.3 (H) 30.0 - 36.0 g/dL   RDW 82.913.0 56.211.5 - 13.015.5 %   Platelets 248 150 - 400 K/uL    Patient Active Problem List   Diagnosis Date Noted  . Supervision of normal pregnancy in third trimester 06/07/2014  . Small for gestational age fetus 06/03/2014  . Abnormal fetal ultrasound 04/22/2014  . Elevated glucose tolerance test 04/07/2014  . Normal pregnancy 04/06/2014  . Vaginal bleeding in  pregnant patient at less than [redacted] weeks gestation 01/19/2014  . First trimester screening 01/06/2014  . Language barrier, cultural differences 12/21/2013  . Prior pregnancy complicated by IUGR, antepartum 12/21/2013    Assessment: Heather Villa is a 28 y.o. G3P2002 at 4272w5d here for in active labor  #Labor:progressing normally, AROM at next check if needed #Pain: Epidural upon request #FWB: Cat I #ID:  GBS neg #MOF: breast #MOC:nexplanon #Circ:  N/a  Heather Villa 07/04/2014, 10:16 AM

## 2014-07-05 MED ORDER — IBUPROFEN 600 MG PO TABS: 600.0000 mg | ORAL_TABLET | ORAL | Status: AC | PRN

## 2014-07-05 NOTE — Lactation Note (Signed)
This note was copied from the chart [facility]. Lactation Consultation Note: Follow up visit with this experienced BF mom before DC. Mom has baby latched to breast when I went into room. With dad interpreting for me, she reports that baby has been latching well with no pain, No questions at present. To call prn  Patient Name: Heather Debria GarretRose Edler [id number]'UToday's Date: 07/05/2014 Reason for consult: Follow-up assessment   Maternal Data Formula Feeding for Exclusion: Yes Reason for exclusion: Mother's choice to formula and breast feed on admission Does the patient have breastfeeding experience prior to this delivery?: Yes  Feeding Feeding Type: Breast Fed  LATCH Score/Interventions Latch: Grasps breast easily, tongue down, lips flanged, rhythmical sucking.  Audible Swallowing: A few with stimulation Intervention(s): Alternate breast massage  Type of Nipple: Everted at rest and after stimulation  Comfort (Breast/Nipple): Soft / non-tender  Problem noted: Mild/Moderate discomfort (EBM)  Hold (Positioning): No assistance needed to correctly position infant at breast. Intervention(s): Breastfeeding basics reviewed  LATCH Score: 9  Lactation Tools Discussed/Used     Consult Status Consult Status: Complete    Pamelia HoitWeeks, Skii Cleland D 07/05/2014, 1:23 PM

## 2014-07-05 NOTE — Plan of Care (Signed)
Problem: Discharge Progression Outcomes Goal: Barriers To Progression Addressed/Resolved Outcome: Not Applicable Date Met:  63/14/97

## 2014-07-05 NOTE — Discharge Summary (Signed)
Obstetric Discharge Summary Reason for Admission: onset of labor Prenatal Procedures: none Intrapartum Procedures: spontaneous vaginal delivery Postpartum Procedures: none Complications-Operative and Postpartum: none HEMOGLOBIN  Date Value Ref Range Status  07/04/2014 14.9 12.0 - 15.0 g/dL Final   HCT  Date Value Ref Range Status  07/04/2014 41.0 36.0 - 46.0 % Final   Hospital Course: Heather Villa is a 28 y.o. G3P3003 who presented at 1441w5d on 11/23 for active labor.  Progressed quickly to NSVD at 1118 am on 11/23.  No postpartum complications. On PPD#1, patient is doing well and would like to be discharged home.  Physical Exam:  General: alert, cooperative and no distress Lochia: appropriate Uterine Fundus: firm DVT Evaluation: No evidence of DVT seen on physical exam.  Discharge Diagnoses: Term Pregnancy-delivered  Discharge Information: Date: 07/05/2014 Activity: pelvic rest Diet: routine Medications: Ibuprofen Condition: stable Instructions: refer to practice specific booklet Discharge to: home Follow-up Information    Follow up with [facility]. Schedule an appointment as soon as possible for a visit in 6 weeks.   Why:  For post-partum visit      [facility].   Why:  As needed for emergencies   Contact information:   [address] [id number] [place] [id number] [phone]      Newborn Data: Live born female  Birth Weight: 7 lb 1.2 oz (3210 g) APGAR: 8, 9  Home with mother.  Heather Villa, Heather Villa 07/05/2014, 7:44 AM

## 2014-07-05 NOTE — Discharge Instructions (Signed)

## 2014-07-05 NOTE — Progress Notes (Signed)
UR chart review completed.  

## 2014-07-05 NOTE — Plan of Care (Signed)
Problem: Phase II Progression Outcomes Goal: Pain controlled on oral analgesia Outcome: Completed/Met Date Met:  07/05/14 Goal: Progress activity as tolerated unless otherwise ordered Outcome: Completed/Met Date Met:  07/05/14 Goal: Afebrile, VS remain stable Outcome: Completed/Met Date Met:  07/05/14 Goal: Incision intact & without signs/symptoms of infection Outcome: Not Applicable Date Met:  78/97/84 Goal: Rh isoimmunization per orders Outcome: Not Applicable Date Met:  78/41/28 Goal: Tolerating diet Outcome: Completed/Met Date Met:  07/05/14 Goal: Other Phase II Outcomes/Goals Outcome: Completed/Met Date Met:  07/05/14

## 2014-08-17 ENCOUNTER — Ambulatory Visit: Payer: Medicaid Other | Admitting: Obstetrics & Gynecology

## 2014-09-01 ENCOUNTER — Telehealth: Payer: Self-pay | Admitting: Family Medicine

## 2014-09-01 ENCOUNTER — Telehealth: Payer: Self-pay | Admitting: *Deleted

## 2014-09-01 NOTE — Telephone Encounter (Signed)
Called patient using burmese interpreter to inform her that her appointment has been changed. Interpreter left voicemail informing patient of new appointment

## 2014-09-01 NOTE — Telephone Encounter (Signed)
Called patient informed that office would be closed tomorrow. Will call back with interpreter to inform of new appointment.

## 2014-09-02 ENCOUNTER — Ambulatory Visit: Payer: Medicaid Other | Admitting: Family Medicine

## 2014-09-09 ENCOUNTER — Encounter: Payer: Self-pay | Admitting: Nurse Practitioner

## 2014-09-09 ENCOUNTER — Ambulatory Visit (INDEPENDENT_AMBULATORY_CARE_PROVIDER_SITE_OTHER): Payer: Medicaid Other | Admitting: Nurse Practitioner

## 2014-09-09 VITALS — BP 112/69 | HR 84 | Temp 98.1°F | Wt 103.6 lb

## 2014-09-09 DIAGNOSIS — Z30019 Encounter for initial prescription of contraceptives, unspecified: Secondary | ICD-10-CM

## 2014-09-09 DIAGNOSIS — Z01812 Encounter for preprocedural laboratory examination: Secondary | ICD-10-CM

## 2014-09-09 DIAGNOSIS — Z309 Encounter for contraceptive management, unspecified: Secondary | ICD-10-CM | POA: Insufficient documentation

## 2014-09-09 LAB — POCT PREGNANCY, URINE: Preg Test, Ur: NEGATIVE

## 2014-09-09 MED ORDER — ETONOGESTREL 68 MG ~~LOC~~ IMPL
68.0000 mg | DRUG_IMPLANT | Freq: Once | SUBCUTANEOUS | Status: AC
  Administered 2014-09-09: 68 mg via SUBCUTANEOUS

## 2014-09-09 NOTE — Patient Instructions (Signed)

## 2014-09-09 NOTE — Addendum Note (Signed)
Addended by: Faythe CasaBELLAMY, Saima Monterroso M on: 09/09/2014 02:00 PM   Modules accepted: Orders

## 2014-09-09 NOTE — Progress Notes (Signed)
Here for postpartum visit. Used InterpreterWin Khine. Wants to get nexplanon for birth control .  States has had intercourse with condoms about 2 weeks ago. C/o breast soreness- is soreness

## 2014-09-09 NOTE — Progress Notes (Signed)
Patient ID: Heather Villa, female   DOB: 06/26/1986, 29 y.o.   MRN: [id number] Subjective:     Heather Villa is a 29 y.o. female who presents for a postpartum visit. She is 8 weeks postpartum following a spontaneous vaginal delivery. I have fully reviewed the prenatal and intrapartum course. The delivery was at 40 gestational weeks. Outcome: spontaneous vaginal delivery. Anesthesia: IV sedation. Postpartum course has been uneventful. Baby's course has been uneventful. Baby is feeding by both breast and bottle - Enfamil AR. Bleeding no bleeding. Bowel function is normal. Bladder function is normal. Patient is sexually active. Contraception method is Nexplanon. Postpartum depression screening: negative.  The following portions of the patient's history were reviewed and updated as appropriate: past family history, past medical history, past social history, past surgical history and problem list.  Review of Systems Pertinent items are noted in HPI.   Objective:    BP 112/69 mmHg  Pulse 84  Temp(Src) 98.1 F (36.7 C)  Wt 103 lb 9.6 oz (46.993 kg)  Breastfeeding? Yes  General:  alert and cooperative   Breasts:  inspection negative, no nipple discharge or bleeding, no masses or nodularity palpable  Lungs: clear to auscultation bilaterally  Heart:  regular rate and rhythm, S1, S2 normal, no murmur, click, rub or gallop  Abdomen: soft, non-tender; bowel sounds normal; no masses,  no organomegaly   Vulva:  not evaluated  Vagina: not evaluated  Cervix:  not examined  Corpus: not examined  Adnexa:  not evaluated  Rectal Exam: Not performed.        Assessment:   Results for orders placed or performed in visit on 09/09/14 (from the past 24 hour(s))  Pregnancy, urine POC     Status: None   Collection Time: 09/09/14 11:34 AM  Result Value Ref Range   Preg Test, Ur NEGATIVE NEGATIVE    NEXPLANON INSERTION  Patient given informed consent, signed copy in the chart, time out was performed. Pregnancy test  was negative Appropriate time out taken.  Patient's left arm was prepped and draped in the usual sterile fashion.. The ruler used to measure and mark insertion area.  Pt was prepped with alcohol swab and then injected with 3 cc of 1 % lidocaine.  Pt was prepped with betadine, Nexplanon removed form packaging. Then inserted per standard guidelines. Patient and provider were able to palpate rod under skin. Pt insertion site covered with sterile dressing.   Minimal blood loss.  Pt tolerated the procedure well.        postpartum exam. Pap smear not done at today's visit.   Plan:    1. Contraception: Nexplanon  3. Follow up in:  1 year or as needed.

## 2014-10-17 ENCOUNTER — Encounter: Payer: Self-pay | Admitting: *Deleted

## 2014-10-28 IMAGING — US US OB FOLLOW-UP
1 series · 12 of 28 positions shown · non-contrast
Comparison: none

[Series 1: us ob follow-up · 0.23mm/px · 12 of 30 slices shown]
[im 2/30]
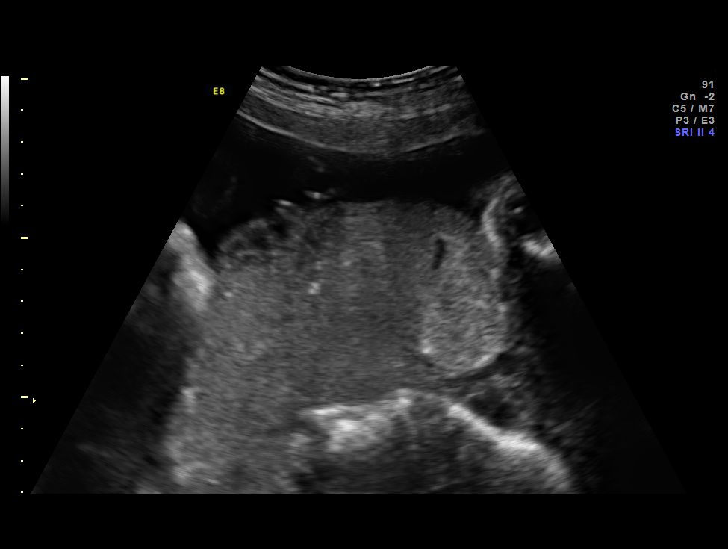
[im 4/30]
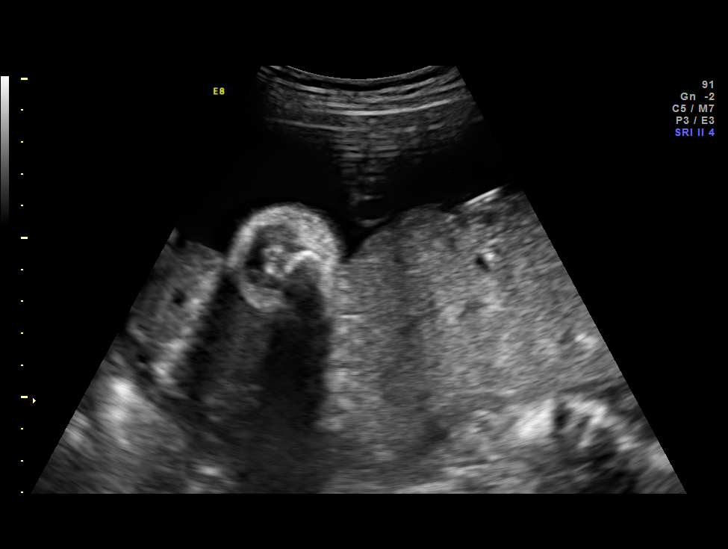
[im 6/30]
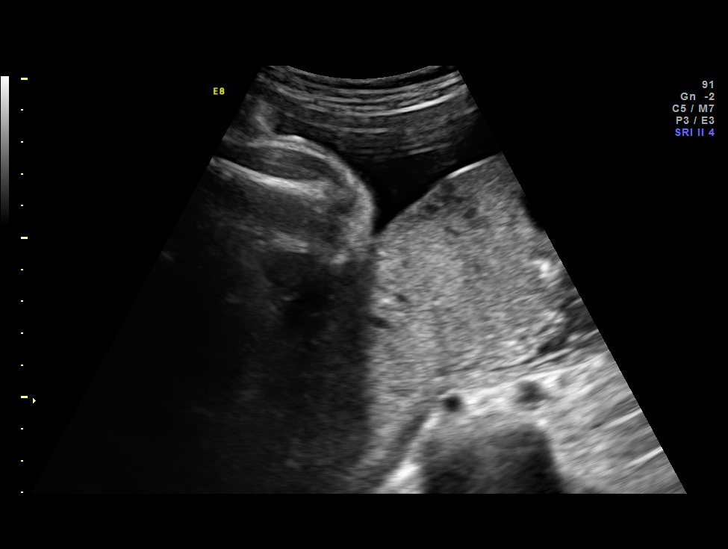
[im 9/30]
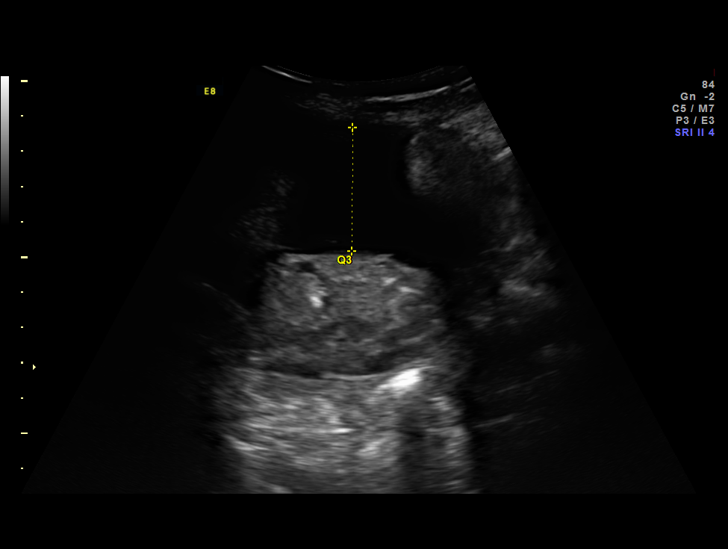
[im 11/30]
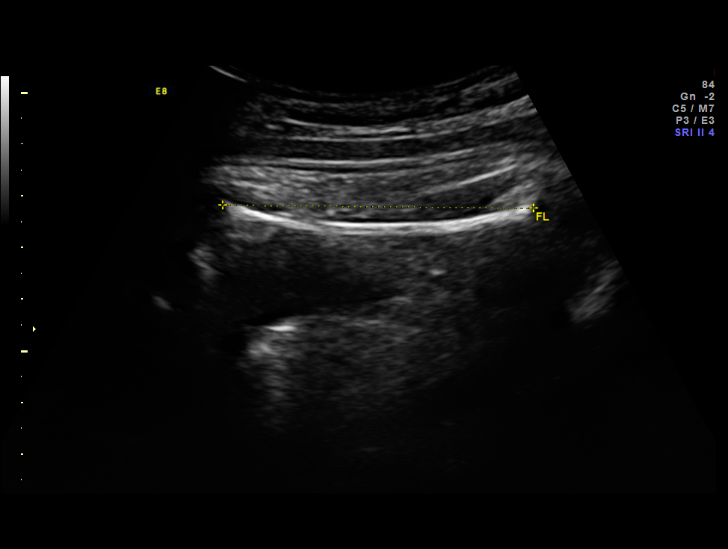
[im 13/30]
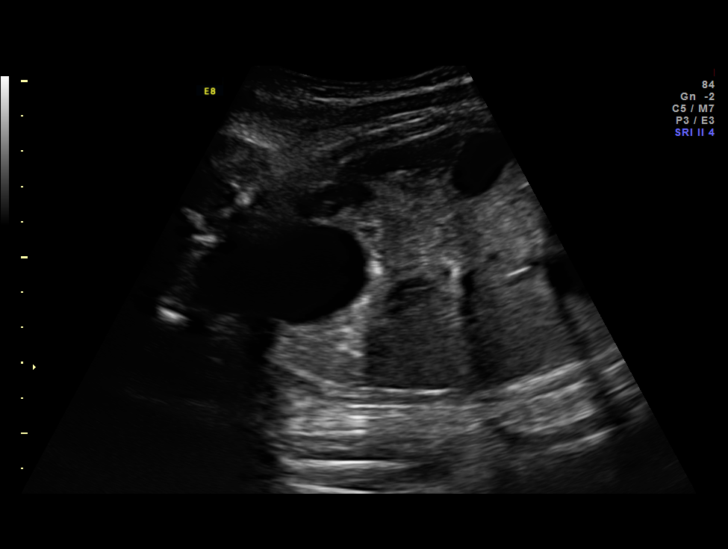
[im 17/30]
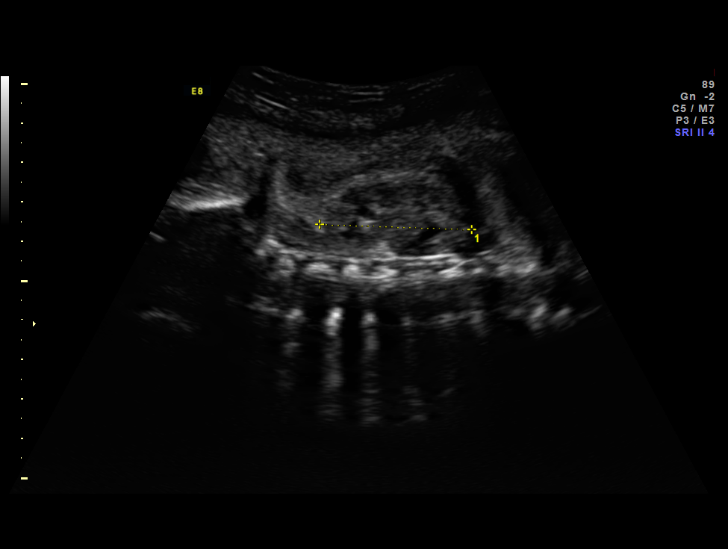
[im 19/30]
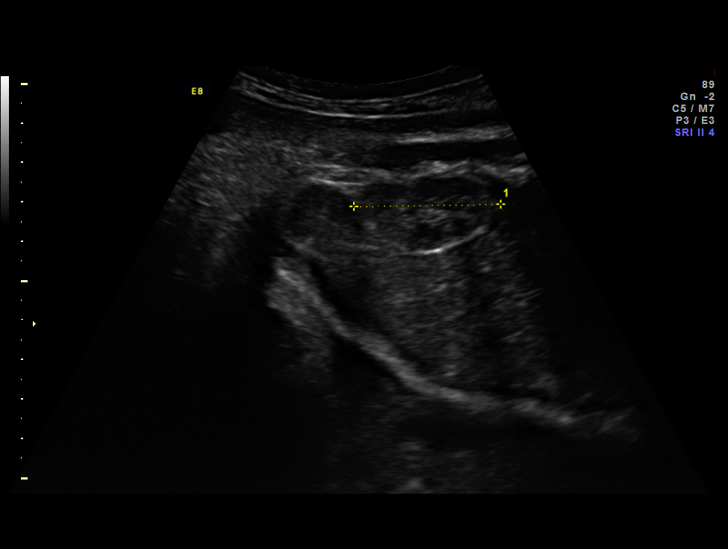
[im 21/30]
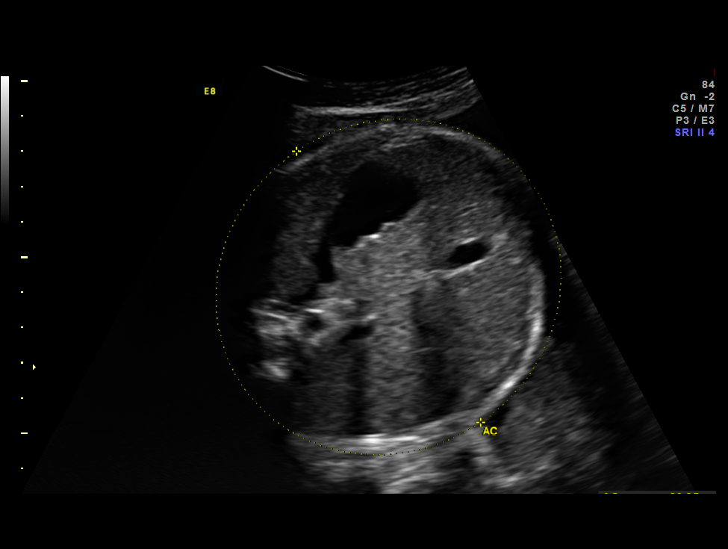
[im 24/30]
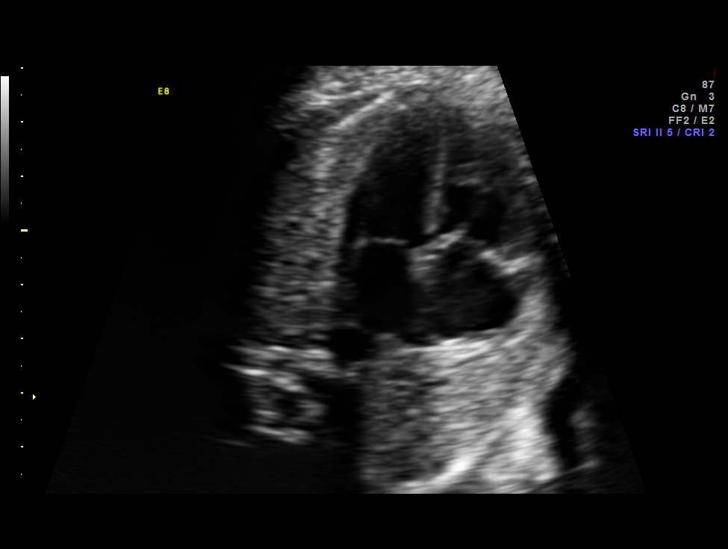
[im 26/30]
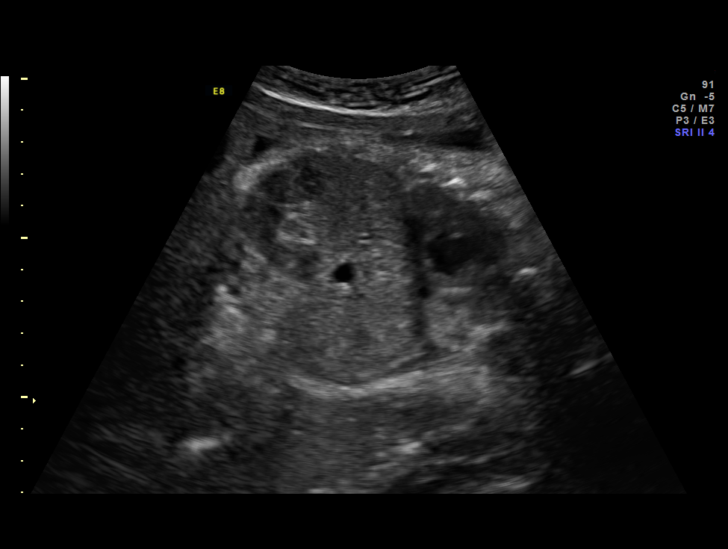
[im 28/30]
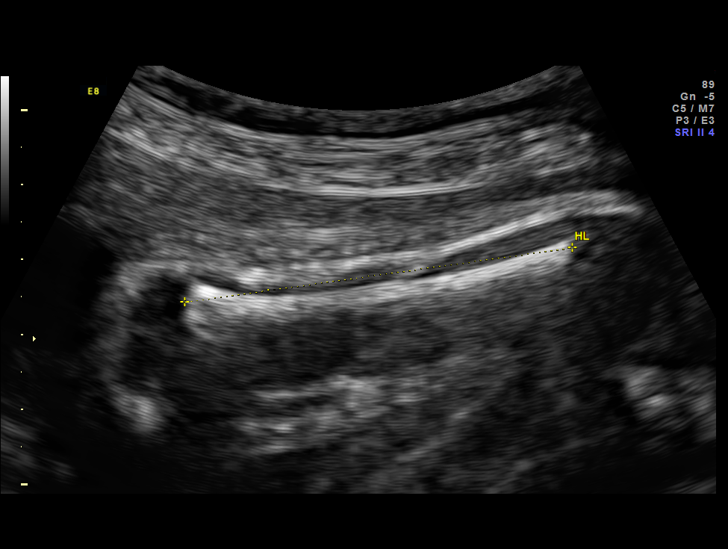

[12 of 28 positions shown; findings below may reference images not displayed]

OBSTETRICS REPORT
                      (Signed Final 06/02/2014 [DATE])

Service(s) Provided

 US OB FOLLOW UP                                       76816.1
Indications

 Poor obstetric history: Previous preterm delivery
 (32 weeks - induced for SGA/FGR; weight 5.5 lbs?)
 36 weeks gestation of pregnancy
 Maternal care for known of suspected poor fetal
 growth, third trimester, not applicable or unspecified
Fetal Evaluation

 Num Of Fetuses:    1
 Fetal Heart Rate:  121                          bpm
 Cardiac Activity:  Observed
 Presentation:      Cephalic
 Placenta:          Posterior, above cervical
                    os
 P. Cord            Previously Visualized
 Insertion:

 Amniotic Fluid
 AFI FV:      Subjectively within normal limits
 AFI Sum:     17.55   cm       65  %Tile     Larg Pckt:    6.98  cm
 RUQ:   3.74    cm   RLQ:    3.32   cm    LUQ:   6.98    cm   LLQ:    3.51   cm
Biometry

 BPD:     88.9  mm     G. Age:  36w 0d                CI:         85.1   70 - 86
 OFD:    104.5  mm                                    FL/HC:      19.7   20.1 -

 HC:       307  mm     G. Age:  34w 1d      < 3  %    HC/AC:      1.01   0.93 -

 AC:     303.5  mm     G. Age:  34w 2d       14  %    FL/BPD:     68.1   71 - 87
 FL:      60.5  mm     G. Age:  31w 3d      < 3  %    FL/AC:      19.9   20 - 24
 HUM:     52.3  mm     G. Age:  30w 3d      < 5  %

 Est. FW:    0016  gm    4 lb 15 oz      12  %
Gestational Age

 LMP:           36w 1d        Date:  09/22/13                 EDD:   06/29/14
 U/S Today:     34w 0d                                        EDD:   07/14/14
 Best:          36w 1d     Det. By:  LMP  (09/22/13)          EDD:   06/29/14
Anatomy

 Cranium:          Previously seen        Aortic Arch:      Previously seen
 Fetal Cavum:      Previously seen        Ductal Arch:      Previously seen
 Ventricles:       Appears normal         Diaphragm:        Appears normal
 Choroid Plexus:   Previously seen        Stomach:          Appears normal, left
                                                            sided
 Cerebellum:       Previously seen        Abdomen:          Previously seen
 Posterior Fossa:  Previously seen        Abdominal Wall:   Previously seen
 Nuchal Fold:      Previously seen        Cord Vessels:     Previously seen
 Face:             Orbits and profile     Kidneys:          Appear normal
                   previously seen
 Lips:             Previously seen        Bladder:          Appears normal
 Heart:            Appears normal         Spine:            Previously seen
                   (4CH, axis, and
                   situs)
 RVOT:             Previously seen        Lower             Previously seen
                                          Extremities:
 LVOT:             Previously seen        Upper             Previously seen
                                          Extremities:

 Other:  Female gender previously seen. Heels and 5th digit previously seen.
Cervix Uterus Adnexa

 Cervix:       Not visualized (advanced GA >38wks)
Impression

 SIUP at 36+1 weeks
 Normal interval anatomy; anatomic survey complete
 Normal amniotic fluid volume
 Appropriate interval growth with EFW at the 12th %tile; AC at
 the 14th %tile
Recommendations

 Follow-up as clinically indicated

 Thank you for sharing in the care of Ms. GERHARD AWE with
 questions or concerns.
